# Patient Record
Sex: Male | Born: 1940 | Race: White | Hispanic: No | Marital: Married | Smoking: Former smoker
Health system: Southern US, Community
[De-identification: ages and names within clinical notes are randomized; demographics above are authoritative.]

## PROBLEM LIST (undated history)

## (undated) DIAGNOSIS — E785 Hyperlipidemia, unspecified: Principal | ICD-10-CM

## (undated) DIAGNOSIS — F0390 Unspecified dementia without behavioral disturbance: Secondary | ICD-10-CM

## (undated) DIAGNOSIS — Z0000 Encounter for general adult medical examination without abnormal findings: Principal | ICD-10-CM

## (undated) DIAGNOSIS — L98499 Non-pressure chronic ulcer of skin of other sites with unspecified severity: Secondary | ICD-10-CM

## (undated) DIAGNOSIS — Z9289 Personal history of other medical treatment: Secondary | ICD-10-CM

## (undated) DIAGNOSIS — J449 Chronic obstructive pulmonary disease, unspecified: Secondary | ICD-10-CM

## (undated) DIAGNOSIS — R972 Elevated prostate specific antigen [PSA]: Secondary | ICD-10-CM

## (undated) DIAGNOSIS — J300 Vasomotor rhinitis: Secondary | ICD-10-CM

## (undated) DIAGNOSIS — Z8619 Personal history of other infectious and parasitic diseases: Secondary | ICD-10-CM

## (undated) DIAGNOSIS — J209 Acute bronchitis, unspecified: Principal | ICD-10-CM

## (undated) HISTORY — DX: Non-pressure chronic ulcer of skin of other sites with unspecified severity: L98.499

## (undated) HISTORY — DX: Acute bronchitis, unspecified: J20.9

## (undated) HISTORY — DX: Personal history of other infectious and parasitic diseases: Z86.19

## (undated) HISTORY — DX: Encounter for general adult medical examination without abnormal findings: Z00.00

## (undated) HISTORY — DX: Personal history of other medical treatment: Z92.89

## (undated) HISTORY — PX: TONSILLECTOMY: SHX5217

## (undated) HISTORY — DX: Chronic obstructive pulmonary disease, unspecified: J44.9

## (undated) HISTORY — DX: Vasomotor rhinitis: J30.0

## (undated) HISTORY — DX: Hyperlipidemia, unspecified: E78.5

## (undated) HISTORY — DX: Elevated prostate specific antigen (PSA): R97.20

## (undated) HISTORY — DX: Unspecified dementia, unspecified severity, without behavioral disturbance, psychotic disturbance, mood disturbance, and anxiety: F03.90

---

## 1986-12-23 DIAGNOSIS — L98499 Non-pressure chronic ulcer of skin of other sites with unspecified severity: Secondary | ICD-10-CM

## 1986-12-23 DIAGNOSIS — Z9289 Personal history of other medical treatment: Secondary | ICD-10-CM

## 1986-12-23 HISTORY — DX: Non-pressure chronic ulcer of skin of other sites with unspecified severity: L98.499

## 1986-12-23 HISTORY — DX: Personal history of other medical treatment: Z92.89

## 1986-12-23 HISTORY — PX: ABDOMINAL SURGERY: SHX537

## 2011-12-24 HISTORY — PX: HERNIA REPAIR: SHX51

## 2012-03-31 ENCOUNTER — Ambulatory Visit (HOSPITAL_BASED_OUTPATIENT_CLINIC_OR_DEPARTMENT_OTHER)
Admission: RE | Admit: 2012-03-31 | Discharge: 2012-03-31 | Disposition: A | Discharge status: Home / Self Care | Payer: Medicare Other | Source: Ambulatory Visit | Attending: Internal Medicine | Admitting: Internal Medicine

## 2012-03-31 ENCOUNTER — Ambulatory Visit (INDEPENDENT_AMBULATORY_CARE_PROVIDER_SITE_OTHER): Payer: Medicare Other | Admitting: Internal Medicine

## 2012-03-31 ENCOUNTER — Encounter: Payer: Self-pay | Admitting: Internal Medicine

## 2012-03-31 VITALS — BP 130/80 | HR 54 | Temp 97.7°F | Resp 16 | Ht 69.25 in | Wt 148.0 lb

## 2012-03-31 DIAGNOSIS — R259 Unspecified abnormal involuntary movements: Secondary | ICD-10-CM

## 2012-03-31 DIAGNOSIS — K279 Peptic ulcer, site unspecified, unspecified as acute or chronic, without hemorrhage or perforation: Secondary | ICD-10-CM

## 2012-03-31 DIAGNOSIS — R634 Abnormal weight loss: Secondary | ICD-10-CM

## 2012-03-31 DIAGNOSIS — N529 Male erectile dysfunction, unspecified: Secondary | ICD-10-CM | POA: Insufficient documentation

## 2012-03-31 DIAGNOSIS — R413 Other amnesia: Secondary | ICD-10-CM

## 2012-03-31 DIAGNOSIS — R251 Tremor, unspecified: Secondary | ICD-10-CM | POA: Insufficient documentation

## 2012-03-31 DIAGNOSIS — J4489 Other specified chronic obstructive pulmonary disease: Secondary | ICD-10-CM | POA: Insufficient documentation

## 2012-03-31 DIAGNOSIS — J449 Chronic obstructive pulmonary disease, unspecified: Secondary | ICD-10-CM

## 2012-03-31 DIAGNOSIS — K4090 Unilateral inguinal hernia, without obstruction or gangrene, not specified as recurrent: Secondary | ICD-10-CM | POA: Insufficient documentation

## 2012-03-31 LAB — CBC WITH DIFFERENTIAL/PLATELET
Eosinophils Relative: 10 % — ABNORMAL HIGH (ref 0–5)
HCT: 49 % (ref 39.0–52.0)
Hemoglobin: 16.1 g/dL (ref 13.0–17.0)
Lymphocytes Relative: 20 % (ref 12–46)
Lymphs Abs: 1.4 10*3/uL (ref 0.7–4.0)
MCV: 94 fL (ref 78.0–100.0)
Monocytes Absolute: 0.6 10*3/uL (ref 0.1–1.0)
Monocytes Relative: 9 % (ref 3–12)
Neutro Abs: 4.5 10*3/uL (ref 1.7–7.7)
WBC: 7.3 10*3/uL (ref 4.0–10.5)

## 2012-03-31 LAB — BASIC METABOLIC PANEL
BUN: 17 mg/dL (ref 6–23)
CO2: 25 mEq/L (ref 19–32)
Chloride: 104 mEq/L (ref 96–112)
Creat: 0.8 mg/dL (ref 0.50–1.35)
Glucose, Bld: 115 mg/dL — ABNORMAL HIGH (ref 70–99)
Potassium: 4.8 mEq/L (ref 3.5–5.3)

## 2012-03-31 LAB — HEPATIC FUNCTION PANEL
ALT: 16 U/L (ref 0–53)
AST: 24 U/L (ref 0–37)
Albumin: 4.2 g/dL (ref 3.5–5.2)
Bilirubin, Direct: 0.2 mg/dL (ref 0.0–0.3)
Total Protein: 6.7 g/dL (ref 6.0–8.3)

## 2012-03-31 LAB — TSH: TSH: 3.069 u[IU]/mL (ref 0.350–4.500)

## 2012-03-31 NOTE — Assessment & Plan Note (Signed)
Obtain b12, tsh. Consider neurology evaluation pending lab results

## 2012-03-31 NOTE — Progress Notes (Signed)
  Subjective:    Patient ID: Brandon Quail., male    DOB: 03-29-1941, 71 y.o.   MRN: [id number]  HPI Pt presents to clinic for follow up of multiple medical problems. Accompanied by his wife who assists with hx with his permission. Has had difficulty with memory loss especially short term memory. Previous pmd recommended aricept but was not begun due to concern over possible gi side effects. No recent lab evaluation or cranial imaging. No safety concerns at present. 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 Previously failed primadone. Current taking propanolol without side effect. Recently while doing situps/crunches noted possible left inguinal hernia. No pain, swelling or tenderness. Has had ~15lb unintended wt loss over last 2 years. Has good appetite and no gi sx's. Does have remote h/o PUD ~1988.  Past Medical History  Diagnosis Date  . Asthma     childhood- age 89  . History of chicken pox     childhood  . Ulcer of abdomen wall 1988  . History of blood transfusion 1988   Past Surgical History  Procedure Date  . Abdominal surgery 1988    bleed ulcer  . Tonsillectomy     reports that he has quit smoking. He has never used smokeless tobacco. He reports that he drinks alcohol. He reports that he does not use illicit drugs. family history includes Hypertension in his mother.  There is no history of Colon cancer, and Heart disease, and Prostate cancer, and Breast cancer, and Diabetes, . No Known Allergies   Review of Systems  Constitutional: Positive for unexpected weight change. Negative for fever, chills and diaphoresis.  Respiratory: Negative for cough and wheezing.   Cardiovascular: Negative for chest pain.  Gastrointestinal: Negative for abdominal pain and blood in stool.  Neurological: Positive for tremors.  Hematological: Negative for adenopathy.  All other systems reviewed and are negative.       Objective:   Physical Exam  Physical  Exam  Nursing note and vitals reviewed. Constitutional: Appears well-developed and well-nourished. No distress.  HENT:  Head: Normocephalic and atraumatic.  Right Ear: External ear normal.  Left Ear: External ear normal.  Eyes: Conjunctivae are normal. No scleral icterus.  Neck: Neck supple. Carotid bruit is not present.  Cardiovascular: Normal rate, regular rhythm and normal heart sounds.  Exam reveals no gallop and no friction rub.   No murmur heard. Pulmonary/Chest: Effort normal and breath sounds normal. No respiratory distress. He has no wheezes. no rales.  Lymphadenopathy:    He has no cervical adenopathy. no axillary adenopathy. Neurological:Alert.  Skin: Skin is warm and dry. Not diaphoretic.  Psychiatric: Has a normal mood and affect.  GU: slight impulse with valsalva left inguinal area. NT      Assessment & Plan:

## 2012-03-31 NOTE — Assessment & Plan Note (Signed)
Obtain cbc, chem7, tsh, lft and cxr.

## 2012-03-31 NOTE — Assessment & Plan Note (Signed)
Currently asx. Recommend observation. Discussed potential warning signs and indications to seek immediate medical care.

## 2012-04-05 ENCOUNTER — Encounter: Payer: Self-pay | Admitting: Internal Medicine

## 2012-04-06 ENCOUNTER — Telehealth: Payer: Self-pay | Admitting: *Deleted

## 2012-04-06 DIAGNOSIS — R7309 Other abnormal glucose: Secondary | ICD-10-CM

## 2012-04-06 NOTE — Telephone Encounter (Signed)
Labs ok except sl elevated glucose. Add a1c if can if not a1c next visit. Needs to proceed with neurology

## 2012-04-06 NOTE — Telephone Encounter (Signed)
Patient submitted my chart message: I had an appointment with Dr. Rodena Medin on April, 9 and also had blood work done. I have not heard about the results of the tests. I want to know if anything was found that could be contributing to my memory loss. Or do I need to make an appointment with Dr. Vonita Moss, the neurologist who has been seeing me for tremors to also see if he has any ideas to help me with the memory issues.

## 2012-04-06 NOTE — Telephone Encounter (Signed)
Patient informed via my chart.

## 2012-04-21 ENCOUNTER — Telehealth: Payer: Self-pay | Admitting: Internal Medicine

## 2012-04-21 NOTE — Telephone Encounter (Signed)
Received medical records from [facility]. Brandon Young  P: [phone] F: [phone]

## 2012-06-02 ENCOUNTER — Encounter: Payer: Self-pay | Admitting: Internal Medicine

## 2012-06-02 ENCOUNTER — Ambulatory Visit (INDEPENDENT_AMBULATORY_CARE_PROVIDER_SITE_OTHER): Payer: Medicare Other | Admitting: Internal Medicine

## 2012-06-02 VITALS — BP 100/60 | HR 68 | Temp 97.6°F | Resp 18 | Ht 69.25 in | Wt 144.0 lb

## 2012-06-02 DIAGNOSIS — N529 Male erectile dysfunction, unspecified: Secondary | ICD-10-CM

## 2012-06-02 DIAGNOSIS — R7309 Other abnormal glucose: Secondary | ICD-10-CM

## 2012-06-02 DIAGNOSIS — R413 Other amnesia: Secondary | ICD-10-CM

## 2012-06-02 DIAGNOSIS — R634 Abnormal weight loss: Secondary | ICD-10-CM

## 2012-06-02 DIAGNOSIS — R739 Hyperglycemia, unspecified: Secondary | ICD-10-CM | POA: Insufficient documentation

## 2012-06-02 MED ORDER — TADALAFIL 20 MG PO TABS: ORAL_TABLET | ORAL | Status: DC

## 2012-06-02 NOTE — Progress Notes (Signed)
  Subjective:    Patient ID: Brandon Quail., male    DOB: 06/03/1941, 71 y.o.   MRN: [id number]  HPI Pt presents to clinic for followup of multiple medical problems. Since last visit hernia became symptomatic and is now s/p hernia repair without complication. Wt down 4lbs since last visit but complicated by recent surgery and disruption of normal eating pattern. Previously noted 15lb wt loss over 2 year period. Lab evaluation and cxr unremarkable with exception of mildly elevation glucose. Was unable to see neurology for memory loss due to recent surgery but plans to call for another appt soon. Requests refill of cialis and tolerates without side effect.   Past Medical History  Diagnosis Date  . Asthma     childhood- age 64  . History of chicken pox     childhood  . Ulcer of abdomen wall 1988  . History of blood transfusion 1988   Past Surgical History  Procedure Date  . Abdominal surgery 1988    bleed ulcer  . Tonsillectomy     reports that he has quit smoking. He has never used smokeless tobacco. He reports that he drinks alcohol. He reports that he does not use illicit drugs. family history includes Hypertension in his mother.  There is no history of Colon cancer, and Heart disease, and Prostate cancer, and Breast cancer, and Diabetes, . No Known Allergies    Review of Systems see hpi     Objective:   Physical Exam  Nursing note and vitals reviewed. Constitutional: He appears well-developed and well-nourished.  Neurological: He is alert.  Psychiatric: He has a normal mood and affect.          Assessment & Plan:

## 2012-06-02 NOTE — Assessment & Plan Note (Signed)
Obtain a1c.  

## 2012-06-02 NOTE — Assessment & Plan Note (Signed)
Reschedule neurology appt

## 2012-06-02 NOTE — Assessment & Plan Note (Signed)
Weight check one month. Evaluation neg to date.

## 2012-06-02 NOTE — Assessment & Plan Note (Signed)
Stable. rf cialis for prn use

## 2012-06-17 DIAGNOSIS — Z8719 Personal history of other diseases of the digestive system: Secondary | ICD-10-CM | POA: Insufficient documentation

## 2012-06-29 ENCOUNTER — Ambulatory Visit: Payer: Medicare Other | Admitting: Internal Medicine

## 2012-07-28 ENCOUNTER — Other Ambulatory Visit (HOSPITAL_BASED_OUTPATIENT_CLINIC_OR_DEPARTMENT_OTHER): Payer: Self-pay | Admitting: Unknown Physician Specialty

## 2012-07-28 ENCOUNTER — Ambulatory Visit (HOSPITAL_BASED_OUTPATIENT_CLINIC_OR_DEPARTMENT_OTHER)
Admission: RE | Admit: 2012-07-28 | Discharge: 2012-07-28 | Disposition: A | Discharge status: Home / Self Care | Payer: Medicare Other | Source: Ambulatory Visit | Attending: Unknown Physician Specialty | Admitting: Unknown Physician Specialty

## 2012-07-28 DIAGNOSIS — R259 Unspecified abnormal involuntary movements: Secondary | ICD-10-CM | POA: Insufficient documentation

## 2012-07-28 DIAGNOSIS — R413 Other amnesia: Secondary | ICD-10-CM

## 2012-12-31 ENCOUNTER — Other Ambulatory Visit: Payer: Self-pay | Admitting: Internal Medicine

## 2013-01-01 NOTE — Telephone Encounter (Signed)
Cialis request; Last Rx & OV 06.11.2013/SLS Please advise.

## 2013-02-06 ENCOUNTER — Other Ambulatory Visit: Payer: Self-pay

## 2013-04-12 ENCOUNTER — Encounter: Payer: Self-pay | Admitting: Family Medicine

## 2013-04-12 ENCOUNTER — Ambulatory Visit (INDEPENDENT_AMBULATORY_CARE_PROVIDER_SITE_OTHER): Payer: Medicare PPO | Admitting: Family Medicine

## 2013-04-12 ENCOUNTER — Ambulatory Visit (HOSPITAL_BASED_OUTPATIENT_CLINIC_OR_DEPARTMENT_OTHER)
Admission: RE | Admit: 2013-04-12 | Discharge: 2013-04-12 | Disposition: A | Discharge status: Home / Self Care | Payer: Medicare PPO | Source: Ambulatory Visit | Attending: Family Medicine | Admitting: Family Medicine

## 2013-04-12 VITALS — BP 144/90 | HR 52 | Temp 97.4°F | Ht 69.25 in | Wt 155.0 lb

## 2013-04-12 DIAGNOSIS — J209 Acute bronchitis, unspecified: Secondary | ICD-10-CM

## 2013-04-12 DIAGNOSIS — R05 Cough: Secondary | ICD-10-CM | POA: Insufficient documentation

## 2013-04-12 DIAGNOSIS — J3489 Other specified disorders of nose and nasal sinuses: Secondary | ICD-10-CM | POA: Insufficient documentation

## 2013-04-12 DIAGNOSIS — R059 Cough, unspecified: Secondary | ICD-10-CM | POA: Insufficient documentation

## 2013-04-12 MED ORDER — AMOXICILLIN-POT CLAVULANATE 875-125 MG PO TABS: 1.0000 | ORAL_TABLET | Freq: Two times a day (BID) | ORAL | Status: DC

## 2013-04-12 MED ORDER — GUAIFENESIN ER 600 MG PO TB12: 600.0000 mg | ORAL_TABLET | Freq: Two times a day (BID) | ORAL | Status: DC

## 2013-04-12 NOTE — Patient Instructions (Addendum)
  Increase clear fluids Start a probiotic such as Digestive Advantage or Align Try a tongue blade for the the tongue  Bronchitis Bronchitis is the body's way of reacting to injury and/or infection (inflammation) of the bronchi. Bronchi are the air tubes that extend from the windpipe into the lungs. If the inflammation becomes severe, it may cause shortness of breath. CAUSES  Inflammation may be caused by:  A virus.  Germs (bacteria).  Dust.  Allergens.  Pollutants and many other irritants. The cells lining the bronchial tree are covered with tiny hairs (cilia). These constantly beat upward, away from the lungs, toward the mouth. This keeps the lungs free of pollutants. When these cells become too irritated and are unable to do their job, mucus begins to develop. This causes the characteristic cough of bronchitis. The cough clears the lungs when the cilia are unable to do their job. Without either of these protective mechanisms, the mucus would settle in the lungs. Then you would develop pneumonia. Smoking is a common cause of bronchitis and can contribute to pneumonia. Stopping this habit is the single most important thing you can do to help yourself. TREATMENT   Your caregiver may prescribe an antibiotic if the cough is caused by bacteria. Also, medicines that open up your airways make it easier to breathe. Your caregiver may also recommend or prescribe an expectorant. It will loosen the mucus to be coughed up. Only take over-the-counter or prescription medicines for pain, discomfort, or fever as directed by your caregiver.  Removing whatever causes the problem (smoking, for example) is critical to preventing the problem from getting worse.  Cough suppressants may be prescribed for relief of cough symptoms.  Inhaled medicines may be prescribed to help with symptoms now and to help prevent problems from returning.  For those with recurrent (chronic) bronchitis, there may be a need for  steroid medicines. SEEK IMMEDIATE MEDICAL CARE IF:   During treatment, you develop more pus-like mucus (purulent sputum).  You have a fever.  Your baby is older than 3 months with a rectal temperature of 102 F (38.9 C) or higher.  Your baby is 57 months old or younger with a rectal temperature of 100.4 F (38 C) or higher.  You become progressively more ill.  You have increased difficulty breathing, wheezing, or shortness of breath. It is necessary to seek immediate medical care if you are elderly or sick from any other disease. MAKE SURE YOU:   Understand these instructions.  Will watch your condition.  Will get help right away if you are not doing well or get worse. Document Released: 12/09/2005 Document Revised: 03/02/2012 Document Reviewed: 10/18/2008 [facility] Patient Information 2013 [place], [place].

## 2013-04-14 ENCOUNTER — Encounter: Payer: Self-pay | Admitting: Family Medicine

## 2013-04-14 DIAGNOSIS — J209 Acute bronchitis, unspecified: Secondary | ICD-10-CM | POA: Insufficient documentation

## 2013-04-14 HISTORY — DX: Acute bronchitis, unspecified: J20.9

## 2013-04-14 NOTE — Progress Notes (Signed)
Patient ID: Brandon Habib., male   DOB: October 01, 1941, 72 y.o.   MRN: [id number] Brandon Young [id number] 1941-08-08 04/14/2013      Progress Note-Follow Up  Subjective  Chief Complaint  Chief Complaint  Patient presents with  . Follow-up    Bronchitis    HPI  Patient is a 72 year old male who is in today accompanied by his wife. He has a history of tremor and dementia history sling returned from a cruise and is struggling with a cough. On a cruise he was treated with frequent breathing treatments and antibiotics for bronchitis. He improved some but is worsened again. He said some chills and malaise. He has cough and congestion. No chest pain or palpitations. No shortness of breath GI or GU complaints  Past Medical History  Diagnosis Date  . Asthma     childhood- age 4  . History of chicken pox     childhood  . Ulcer of abdomen wall 1988  . History of blood transfusion 1988  . Acute bronchitis 04/14/2013    Past Surgical History  Procedure Laterality Date  . Abdominal surgery  1988    bleed ulcer  . Tonsillectomy      Family History  Problem Relation Age of Onset  . Colon cancer Neg Hx   . Heart disease Neg Hx   . Prostate cancer Neg Hx   . Breast cancer Neg Hx   . Diabetes Neg Hx   . Hypertension Mother     History   Social History  . Marital Status: Married    Spouse Name: N/A    Number of Children: N/A  . Years of Education: N/A   Occupational History  . Not on file.   Social History Main Topics  . Smoking status: Former Games developer  . Smokeless tobacco: Never Used     Comment: 1 ppd for 30 years  . Alcohol Use: Yes     Comment: wine  . Drug Use: No  . Sexually Active: Not on file   Other Topics Concern  . Not on file   Social History Narrative  . No narrative on file    Current Outpatient Prescriptions on File Prior to Visit  Medication Sig Dispense Refill  . aspirin 81 MG tablet Take 81 mg by mouth daily.      Marland Kitchen CIALIS 20 MG tablet  TAKE 1 TABLET BY MOUTH EVERY 36 HOURS AS NEEDED  5 tablet  3  . Multiple Vitamins-Minerals (CENTRUM SILVER PO) Take by mouth daily.      . propranolol (INDERAL) 40 MG tablet Take 40 mg by mouth. Take 2 tablets by mouth twice a day       No current facility-administered medications on file prior to visit.    No Known Allergies  Review of Systems  Review of Systems  Constitutional: Positive for chills and malaise/fatigue. Negative for fever.  HENT: Positive for congestion.   Eyes: Negative for discharge.  Respiratory: Positive for cough. Negative for shortness of breath.   Cardiovascular: Negative for chest pain, palpitations and leg swelling.  Gastrointestinal: Negative for nausea, abdominal pain and diarrhea.  Genitourinary: Negative for dysuria.  Musculoskeletal: Negative for falls.  Skin: Negative for rash.  Neurological: Positive for tremors. Negative for loss of consciousness and headaches.  Endo/Heme/Allergies: Negative for polydipsia.  Psychiatric/Behavioral: Negative for depression and suicidal ideas. The patient is not nervous/anxious and does not have insomnia.     Objective  BP 144/90  Pulse 52  Temp(Src) 97.4 F (36.3 C) (Oral)  Ht 5' 9.25" (1.759 m)  Wt 155 lb (70.308 kg)  BMI 22.72 kg/m2  SpO2 97%  Physical Exam  Physical Exam  Constitutional: He is oriented to person, place, and time and well-developed, well-nourished, and in no distress. No distress.  HENT:  Head: Normocephalic and atraumatic.  Eyes: Conjunctivae are normal.  Neck: Neck supple. No thyromegaly present.  Cardiovascular: Normal rate, regular rhythm and normal heart sounds.   No murmur heard. Pulmonary/Chest: Effort normal and breath sounds normal. No respiratory distress.  Abdominal: He exhibits no distension and no mass. There is no tenderness.  Musculoskeletal: He exhibits no edema.  Neurological: He is alert and oriented to person, place, and time.  Skin: Skin is warm.  Psychiatric:  Memory, affect and judgment normal.    Lab Results  Component Value Date   TSH 3.069 03/31/2012   Lab Results  Component Value Date   WBC 7.3 03/31/2012   HGB 16.1 03/31/2012   HCT 49.0 03/31/2012   MCV 94.0 03/31/2012   PLT 212 03/31/2012   Lab Results  Component Value Date   CREATININE 0.80 03/31/2012   BUN 17 03/31/2012   NA 140 03/31/2012   K 4.8 03/31/2012   CL 104 03/31/2012   CO2 25 03/31/2012   Lab Results  Component Value Date   ALT 16 03/31/2012   AST 24 03/31/2012   ALKPHOS 87 03/31/2012   BILITOT 0.8 03/31/2012    Assessment & Plan  Acute bronchitis cxr unconcerning but has symptoms for weeks unresolving, started on Augmentin, Mucinex and probiotics.

## 2013-04-14 NOTE — Assessment & Plan Note (Signed)
cxr unconcerning but has symptoms for weeks unresolving, started on Augmentin, Mucinex and probiotics.

## 2013-04-30 ENCOUNTER — Ambulatory Visit (INDEPENDENT_AMBULATORY_CARE_PROVIDER_SITE_OTHER): Payer: Medicare PPO | Admitting: Family Medicine

## 2013-04-30 ENCOUNTER — Encounter: Payer: Self-pay | Admitting: Family Medicine

## 2013-04-30 VITALS — BP 108/68 | HR 68 | Temp 97.5°F | Ht 69.25 in | Wt 149.1 lb

## 2013-04-30 DIAGNOSIS — R251 Tremor, unspecified: Secondary | ICD-10-CM

## 2013-04-30 DIAGNOSIS — Z1211 Encounter for screening for malignant neoplasm of colon: Secondary | ICD-10-CM

## 2013-04-30 DIAGNOSIS — R259 Unspecified abnormal involuntary movements: Secondary | ICD-10-CM

## 2013-04-30 DIAGNOSIS — F0390 Unspecified dementia without behavioral disturbance: Secondary | ICD-10-CM | POA: Insufficient documentation

## 2013-04-30 DIAGNOSIS — Z0000 Encounter for general adult medical examination without abnormal findings: Secondary | ICD-10-CM

## 2013-04-30 DIAGNOSIS — J209 Acute bronchitis, unspecified: Secondary | ICD-10-CM

## 2013-04-30 DIAGNOSIS — R7309 Other abnormal glucose: Secondary | ICD-10-CM

## 2013-04-30 DIAGNOSIS — J449 Chronic obstructive pulmonary disease, unspecified: Secondary | ICD-10-CM

## 2013-04-30 DIAGNOSIS — R739 Hyperglycemia, unspecified: Secondary | ICD-10-CM

## 2013-04-30 DIAGNOSIS — N529 Male erectile dysfunction, unspecified: Secondary | ICD-10-CM

## 2013-04-30 LAB — HEPATIC FUNCTION PANEL
ALT: 31 U/L (ref 0–53)
Alkaline Phosphatase: 96 U/L (ref 39–117)
Bilirubin, Direct: 0.1 mg/dL (ref 0.0–0.3)
Indirect Bilirubin: 0.7 mg/dL (ref 0.0–0.9)
Total Protein: 6.9 g/dL (ref 6.0–8.3)

## 2013-04-30 LAB — CBC
HCT: 48.1 % (ref 39.0–52.0)
MCV: 91.6 fL (ref 78.0–100.0)
RBC: 5.25 MIL/uL (ref 4.22–5.81)
WBC: 7.9 10*3/uL (ref 4.0–10.5)

## 2013-04-30 LAB — RENAL FUNCTION PANEL
BUN: 17 mg/dL (ref 6–23)
Chloride: 101 mEq/L (ref 96–112)
Glucose, Bld: 112 mg/dL — ABNORMAL HIGH (ref 70–99)
Phosphorus: 3 mg/dL (ref 2.3–4.6)
Potassium: 5.1 mEq/L (ref 3.5–5.3)
Sodium: 138 mEq/L (ref 135–145)

## 2013-04-30 LAB — PSA: PSA: 4.14 ng/mL — ABNORMAL HIGH (ref <=4.00)

## 2013-04-30 LAB — LIPID PANEL
HDL: 61 mg/dL (ref >39)
LDL Cholesterol: 146 mg/dL — ABNORMAL HIGH (ref 0–99)
VLDL: 20 mg/dL (ref 0–40)

## 2013-04-30 MED ORDER — ALBUTEROL SULFATE HFA 108 (90 BASE) MCG/ACT IN AERS: 2.0000 | INHALATION_SPRAY | Freq: Four times a day (QID) | RESPIRATORY_TRACT | Status: AC | PRN

## 2013-04-30 NOTE — Patient Instructions (Addendum)

## 2013-05-02 ENCOUNTER — Encounter: Payer: Self-pay | Admitting: Family Medicine

## 2013-05-02 DIAGNOSIS — Z0000 Encounter for general adult medical examination without abnormal findings: Secondary | ICD-10-CM | POA: Insufficient documentation

## 2013-05-02 DIAGNOSIS — J449 Chronic obstructive pulmonary disease, unspecified: Secondary | ICD-10-CM

## 2013-05-02 HISTORY — DX: Chronic obstructive pulmonary disease, unspecified: J44.9

## 2013-05-02 MED ORDER — TIOTROPIUM BROMIDE MONOHYDRATE 18 MCG IN CAPS: 18.0000 ug | ORAL_CAPSULE | Freq: Every day | RESPIRATORY_TRACT | Status: DC

## 2013-05-02 NOTE — Assessment & Plan Note (Signed)
They are interested in moving their neurology care to [facility], referred to [facility] Neurology. Maintain Donepezil and encouraged to start some krill oil caps

## 2013-05-02 NOTE — Assessment & Plan Note (Signed)
stable °

## 2013-05-02 NOTE — Progress Notes (Signed)
Patient ID: Brandon Young., male   DOB: Apr 17, 1941, 72 y.o.   MRN: [id number] Brandon Young [id number] 01-Dec-1941 05/02/2013      Progress Note-Follow Up  Subjective  Chief Complaint  Chief Complaint  Patient presents with  . medicare wellness    HPI  Patient is a 72 year old Caucasian male who is in today for wellness exam and followup of multiple medical problems. He is accompanied by his wife. They live in an assisted living facility and doing well. But to manage their daily activities and self-care. They have no concerns regarding depression. They're worried about his worsening dementia and are considering switching neurologists. He is tolerating the donepezil. He denies any recent illness or they do note is having worsening shortness of breath with exertion. No fevers or chills. No congestion or headache. No chest pain, palpitations, shortness of, GI or GU concerns. No recent changes in vision or hearing. Past Medical History  Diagnosis Date  . Asthma     childhood- age 8  . History of chicken pox     childhood  . Ulcer of abdomen wall 1988  . History of blood transfusion 1988  . Acute bronchitis 04/14/2013  . Dementia   . COPD (chronic obstructive pulmonary disease) 05/02/2013    Past Surgical History  Procedure Laterality Date  . Abdominal surgery  1988    bleed ulcer  . Tonsillectomy      Family History  Problem Relation Age of Onset  . Colon cancer Neg Hx   . Heart disease Neg Hx   . Prostate cancer Neg Hx   . Breast cancer Neg Hx   . Diabetes Neg Hx   . Hypertension Mother   . Dementia Mother   . Other Father     GI infection/disturbance  . Tremor Father   . COPD Sister     smoker  . Tremor Sister   . Tremor Brother   . Tremor Sister     History   Social History  . Marital Status: Married    Spouse Name: N/A    Number of Children: N/A  . Years of Education: N/A   Occupational History  . Not on file.   Social History Main Topics   . Smoking status: Former Games developer  . Smokeless tobacco: Never Used     Comment: 1 ppd for 30 years  . Alcohol Use: Yes     Comment: wine  . Drug Use: No  . Sexually Active: Yes   Other Topics Concern  . Not on file   Social History Narrative  . No narrative on file    Current Outpatient Prescriptions on File Prior to Visit  Medication Sig Dispense Refill  . aspirin 81 MG tablet Take 81 mg by mouth daily.      Marland Kitchen CIALIS 20 MG tablet TAKE 1 TABLET BY MOUTH EVERY 36 HOURS AS NEEDED  5 tablet  3  . guaiFENesin (MUCINEX) 600 MG 12 hr tablet Take 1 tablet (600 mg total) by mouth 2 (two) times daily.  20 tablet  0  . Multiple Vitamins-Minerals (CENTRUM SILVER PO) Take by mouth daily.      . propranolol (INDERAL) 40 MG tablet Take 40 mg by mouth. Take 2 tablets by mouth twice a day       No current facility-administered medications on file prior to visit.    No Known Allergies  Review of Systems  Review of Systems  Constitutional: Negative for fever and malaise/fatigue.  HENT: Negative for congestion.   Eyes: Negative for discharge.  Respiratory: Positive for shortness of breath. Negative for wheezing.   Cardiovascular: Negative for chest pain, palpitations and leg swelling.  Gastrointestinal: Negative for nausea, abdominal pain and diarrhea.  Genitourinary: Negative for dysuria.  Musculoskeletal: Negative for falls.  Skin: Negative for rash.  Neurological: Positive for tremors. Negative for loss of consciousness and headaches.  Endo/Heme/Allergies: Negative for polydipsia.  Psychiatric/Behavioral: Positive for memory loss. Negative for depression and suicidal ideas. The patient is not nervous/anxious and does not have insomnia.     Objective  BP 108/68  Pulse 68  Temp(Src) 97.5 F (36.4 C) (Oral)  Ht 5' 9.25" (1.759 m)  Wt 149 lb 1.3 oz (67.622 kg)  BMI 21.86 kg/m2  SpO2 90%  Physical Exam  Physical Exam  Constitutional: He is oriented to person, place, and time and  well-developed, well-nourished, and in no distress. No distress.  HENT:  Head: Normocephalic and atraumatic.  Eyes: Conjunctivae are normal.  Neck: Neck supple. No thyromegaly present.  Cardiovascular: Normal rate, regular rhythm and normal heart sounds.   No murmur heard. Pulmonary/Chest: Effort normal and breath sounds normal. No respiratory distress.  Abdominal: He exhibits no distension and no mass. There is no tenderness.  Musculoskeletal: He exhibits no edema.  Neurological: He is alert and oriented to person, place, and time.  Mild resting tremor  Skin: Skin is warm.  Psychiatric: Memory, affect and judgment normal.    Lab Results  Component Value Date   TSH 2.906 04/30/2013   Lab Results  Component Value Date   WBC 7.9 04/30/2013   HGB 16.6 04/30/2013   HCT 48.1 04/30/2013   MCV 91.6 04/30/2013   PLT 241 04/30/2013   Lab Results  Component Value Date   CREATININE 0.90 04/30/2013   BUN 17 04/30/2013   NA 138 04/30/2013   K 5.1 04/30/2013   CL 101 04/30/2013   CO2 28 04/30/2013   Lab Results  Component Value Date   ALT 31 04/30/2013   AST 43* 04/30/2013   ALKPHOS 96 04/30/2013   BILITOT 0.8 04/30/2013   Lab Results  Component Value Date   CHOL 227* 04/30/2013   Lab Results  Component Value Date   HDL 61 04/30/2013   Lab Results  Component Value Date   LDLCALC 146* 04/30/2013   Lab Results  Component Value Date   TRIG 101 04/30/2013   Lab Results  Component Value Date   CHOLHDL 3.7 04/30/2013     Assessment & Plan  Encounter for Medicare annual wellness exam Referred for screening colonoscopy. Doing well at his Assisted Living facility. Patient lives in an assisted living facility at times all of his ADLs well. They do not have any advanced directives and are not interested in a DO NOT RESUSCITATE at this time.  Dr. [name] of neurology at [facility] and no other M.D. did this time. Denies any depression.  COPD (chronic obstructive pulmonary disease) Spirometry done today,  given Albuterol to use prn and started on Spiriva and referred to Pulmonology  Dementia They are interested in moving their neurology care to [facility], referred to [facility] Neurology. Maintain Donepezil and encouraged to start some krill oil caps  Tremor stable  Hyperglycemia Mild, hgba1c is 5.3, minimize simple carbs

## 2013-05-02 NOTE — Assessment & Plan Note (Addendum)
Referred for screening colonoscopy. Doing well at his Assisted Living facility. Patient lives in an assisted living facility at times all of his ADLs well. They do not have any advanced directives and are not interested in a DO NOT RESUSCITATE at this time.  Dr. [name] of neurology at [facility] and no other M.D. did this time. Denies any depression.

## 2013-05-02 NOTE — Assessment & Plan Note (Signed)
Mild, hgba1c is 5.3, minimize simple carbs

## 2013-05-02 NOTE — Assessment & Plan Note (Signed)
Spirometry done today, given Albuterol to use prn and started on Spiriva and referred to Pulmonology

## 2013-05-05 ENCOUNTER — Other Ambulatory Visit: Payer: Self-pay | Admitting: Family Medicine

## 2013-05-05 DIAGNOSIS — R972 Elevated prostate specific antigen [PSA]: Secondary | ICD-10-CM

## 2013-05-05 MED ORDER — ATORVASTATIN CALCIUM 10 MG PO TABS: 10.0000 mg | ORAL_TABLET | Freq: Every day | ORAL | Status: DC

## 2013-05-05 NOTE — Addendum Note (Signed)
Addended by: Court Joy on: 05/05/2013 04:17 PM   Modules accepted: Orders

## 2013-05-18 ENCOUNTER — Encounter: Payer: Self-pay | Admitting: Diagnostic Neuroimaging

## 2013-05-18 ENCOUNTER — Ambulatory Visit (INDEPENDENT_AMBULATORY_CARE_PROVIDER_SITE_OTHER): Payer: Medicare PPO | Admitting: Diagnostic Neuroimaging

## 2013-05-18 VITALS — BP 128/80 | HR 53 | Ht 70.0 in | Wt 150.0 lb

## 2013-05-18 DIAGNOSIS — F0390 Unspecified dementia without behavioral disturbance: Secondary | ICD-10-CM

## 2013-05-18 NOTE — Patient Instructions (Addendum)
Our Designer, industrial/product, Lorenza Burton, will give you information about the [facility] [employer] clinical study.  Follow up appt. in 6 months.

## 2013-05-18 NOTE — Progress Notes (Signed)
[facility]  PATIENT: Brandon Young. DOB: 27-May-1941  REFERRING CLINICIAN: [name], MD HISTORY FROM: patient, wife, chart REASON FOR VISIT: consult for memory loss   HISTORICAL  CHIEF COMPLAINT:  Chief Complaint  Patient presents with  . Dementia    Np#7    HISTORY OF PRESENT ILLNESS:   Subjective:   72 y.o. right handed Caucasian male referred by Brandon Edge, MD for evaluation and treatment of cognitive problems. He is accompanied by spouse. He is currently a patient of Brandon Young, Neurology in [place].  Patient is able to do his own ADLs.  The family and the patient identify problems with changes in short term memory. Wife states she started noticing problems with short term memory 6 years ago. Family and patient report problems with disinterest in activities that he used to enjoy such as golf, which started about 3 years ago.  Family and patient are concerned about progression of the memory loss and want a 2nd opinion of treatment options.  Medication administration: wife monitors medication usage  Patient is driving with wife as passenger only.  Patient denies visual and sensory hallucinations.  Denies change in appetite.  Patient has been taking Donepezil for approximately a year and a half, now taking 10mg  twice daily with no side effects.  Functional Assessment:  Activities of Daily Living (ADLs):   He is independent in the following: ambulation, bathing and hygiene, feeding, continence, grooming, toileting and dressing Requires assistance with the following: medication administration.  Outside reports reviewed: historical medical records, imaging reports: normal for age MRI, lab reports and referral letter/letters.  The following portions of the patient's history were reviewed and updated as appropriate: allergies, current medications, past family history, past medical history, past social history, past surgical history and problem  list.  Review of Systems Constitutional: positive for feeling cold Hematologic/lymphatic: positive for easy bruising Neurological: positive for memory problems and confusion Behavioral/Psych: positive for loss of interest in favorite activities and decreased energy Endocrine: positive for temperature intolerance Allergic/Immunologic: allergies, runny nose   ALLERGIES: No Known Allergies  HOME MEDICATIONS: Outpatient Prescriptions Prior to Visit  Medication Sig Dispense Refill  . albuterol (PROVENTIL HFA;VENTOLIN HFA) 108 (90 BASE) MCG/ACT inhaler Inhale 2 puffs into the lungs every 6 (six) hours as needed for wheezing or shortness of breath.  1 Inhaler  5  . aspirin 81 MG tablet Take 81 mg by mouth daily.      Marland Kitchen atorvastatin (LIPITOR) 10 MG tablet Take 1 tablet (10 mg total) by mouth daily.  30 tablet  3  . CIALIS 20 MG tablet TAKE 1 TABLET BY MOUTH EVERY 36 HOURS AS NEEDED  5 tablet  3  . donepezil (ARICEPT) 10 MG tablet Take 2 tablets (20 mg total) by mouth at bedtime.      Marland Kitchen guaiFENesin (MUCINEX) 600 MG 12 hr tablet Take 1 tablet (600 mg total) by mouth 2 (two) times daily.  20 tablet  0  . Multiple Vitamins-Minerals (CENTRUM SILVER PO) Take by mouth daily.      . propranolol (INDERAL) 40 MG tablet Take 40 mg by mouth. Take 2 tablets by mouth twice a day      . tiotropium (SPIRIVA HANDIHALER) 18 MCG inhalation capsule Place 1 capsule (18 mcg total) into inhaler and inhale daily.  30 capsule  3   No facility-administered medications prior to visit.    PAST MEDICAL HISTORY: Past Medical History  Diagnosis Date  . Asthma  childhood- age 60  . History of chicken pox     childhood  . Ulcer of abdomen wall 1988  . History of blood transfusion 1988  . Acute bronchitis 04/14/2013  . Dementia   . COPD (chronic obstructive pulmonary disease) 05/02/2013    PAST SURGICAL HISTORY: Past Surgical History  Procedure Laterality Date  . Abdominal surgery  1988    bleed ulcer  .  Tonsillectomy    . Hernia repair  2013    FAMILY HISTORY: Family History  Problem Relation Age of Onset  . Colon cancer Neg Hx   . Heart disease Neg Hx   . Prostate cancer Neg Hx   . Breast cancer Neg Hx   . Diabetes Neg Hx   . Hypertension Mother   . Dementia Mother   . Other Father     GI infection/disturbance  . Tremor Father   . COPD Sister     smoker  . Tremor Sister   . Tremor Brother   . Tremor Sister     SOCIAL HISTORY:  History   Social History  . Marital Status: Married    Spouse Name: N/A    Number of Children: 2  . Years of Education: MED   Occupational History  . Not on file.   Social History Main Topics  . Smoking status: Former Games developer  . Smokeless tobacco: Never Used     Comment: 1 ppd for 30 years  . Alcohol Use: Yes     Comment: wine  . Drug Use: No  . Sexually Active: Yes   Other Topics Concern  . Not on file   Social History Narrative  . No narrative on file     PHYSICAL EXAM  Filed Vitals:   05/18/13 0859  BP: 128/80  Pulse: 53  Height: 5\' 10"  (1.778 m)  Weight: 150 lb (68.04 kg)   Body mass index is 21.52 kg/(m^2).  GENERAL EXAM: Patient is in no distress, well developed, well groomed  CARDIOVASCULAR: Regular rate and rhythm, no murmurs, no carotid bruits  NEUROLOGIC: MENTAL STATUS: awake, alert, language fluent, comprehension intact; MMSE 25/30. BORDERLINE MYERSON. NEG SNOUT. NEG PALMOMENTAL. CRANIAL NERVE: no papilledema on fundoscopic exam, pupils equal and reactive to light, visual fields full to confrontation, extraocular muscles intact, no nystagmus, facial sensation and strength symmetric, uvula midline, shoulder shrug symmetric, tongue midline. MOTOR: normal bulk and tone, full strength in the BUE, BLE,  fine finger movements normal. mixed tremor: resting tremor of head and left hand, intention tremor in right hand. SENSORY: normal and symmetric to light touch, pinprick, temperature, vibration COORDINATION:  finger-nose-finger  REFLEXES: deep tendon reflexes present and symmetric GAIT/STATION: narrow based gait; able to walk on toes, heels and tandem; romberg is negative. Decreased arm swing when walking. Borderline Myerson's sign.   DIAGNOSTIC DATA (LABS, IMAGING, TESTING) - I reviewed patient records, labs, notes, testing and imaging myself where available.  Lab Results  Component Value Date   WBC 7.9 04/30/2013   HGB 16.6 04/30/2013   HCT 48.1 04/30/2013   MCV 91.6 04/30/2013   PLT 241 04/30/2013      Component Value Date/Time   NA 138 04/30/2013 0918   K 5.1 04/30/2013 0918   CL 101 04/30/2013 0918   CO2 28 04/30/2013 0918   GLUCOSE 112* 04/30/2013 0918   BUN 17 04/30/2013 0918   CREATININE 0.90 04/30/2013 0918   CALCIUM 9.7 04/30/2013 0918   PROT 6.9 04/30/2013 0918   ALBUMIN 4.2 04/30/2013 1610  ALBUMIN 4.2 04/30/2013 0918   AST 43* 04/30/2013 0918   ALT 31 04/30/2013 0918   ALKPHOS 96 04/30/2013 0918   BILITOT 0.8 04/30/2013 0918   Lab Results  Component Value Date   CHOL 227* 04/30/2013   HDL 61 04/30/2013   LDLCALC 865* 04/30/2013   TRIG 101 04/30/2013   CHOLHDL 3.7 04/30/2013   Lab Results  Component Value Date   HGBA1C 5.3 04/30/2013   Lab Results  Component Value Date   VITAMINB12 738 03/31/2012   Lab Results  Component Value Date   TSH 2.906 04/30/2013    ASSESSMENT AND PLAN  72 y.o. year old male  has a past medical history of Asthma; History of chicken pox; Ulcer of abdomen wall (1988); History of blood transfusion (1988); Acute bronchitis (04/14/2013); Dementia; and COPD (chronic obstructive pulmonary disease) (05/02/2013). here for evaluation of memory loss.  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 Discussed progressive course of the disease and treatment options as well as research studies.  1. Discussed Expedition 3 clinical study, Lorenza Burton, Designer, industrial/product will speak to patient and wife and  provide information. 2. Continue current dose of Donepizil, 10mg  BID.  If further decline in 6 months and patient not enrolled in clinical study, will consider adding Namenda. 3. Follow up in 6 months with Heide Guile, NP.  Suanne Marker, MD (LYNN LAM NP-C 05/18/2013, 9:58 AM) Certified in Neurology, Neurophysiology and Neuroimaging  [facility] [address] [place], [place] [address] [phone]

## 2013-05-20 ENCOUNTER — Encounter (HOSPITAL_BASED_OUTPATIENT_CLINIC_OR_DEPARTMENT_OTHER): Payer: Self-pay | Admitting: *Deleted

## 2013-05-20 ENCOUNTER — Emergency Department (HOSPITAL_BASED_OUTPATIENT_CLINIC_OR_DEPARTMENT_OTHER)
Admission: EM | Admit: 2013-05-20 | Discharge: 2013-05-21 | Disposition: A | Discharge status: Home / Self Care | Payer: Medicare PPO | Attending: Emergency Medicine | Admitting: Emergency Medicine

## 2013-05-20 DIAGNOSIS — Z7982 Long term (current) use of aspirin: Secondary | ICD-10-CM | POA: Insufficient documentation

## 2013-05-20 DIAGNOSIS — Z8619 Personal history of other infectious and parasitic diseases: Secondary | ICD-10-CM | POA: Insufficient documentation

## 2013-05-20 DIAGNOSIS — F0390 Unspecified dementia without behavioral disturbance: Secondary | ICD-10-CM | POA: Insufficient documentation

## 2013-05-20 DIAGNOSIS — J449 Chronic obstructive pulmonary disease, unspecified: Secondary | ICD-10-CM | POA: Insufficient documentation

## 2013-05-20 DIAGNOSIS — Z87891 Personal history of nicotine dependence: Secondary | ICD-10-CM | POA: Insufficient documentation

## 2013-05-20 DIAGNOSIS — N390 Urinary tract infection, site not specified: Secondary | ICD-10-CM

## 2013-05-20 DIAGNOSIS — J4489 Other specified chronic obstructive pulmonary disease: Secondary | ICD-10-CM | POA: Insufficient documentation

## 2013-05-20 DIAGNOSIS — Z8719 Personal history of other diseases of the digestive system: Secondary | ICD-10-CM | POA: Insufficient documentation

## 2013-05-20 DIAGNOSIS — Z79899 Other long term (current) drug therapy: Secondary | ICD-10-CM | POA: Insufficient documentation

## 2013-05-20 LAB — URINALYSIS, ROUTINE W REFLEX MICROSCOPIC
Bilirubin Urine: NEGATIVE
Glucose, UA: NEGATIVE mg/dL
Ketones, ur: NEGATIVE mg/dL
Protein, ur: NEGATIVE mg/dL
pH: 6.5 (ref 5.0–8.0)

## 2013-05-20 LAB — URINE MICROSCOPIC-ADD ON

## 2013-05-20 MED ORDER — PHENAZOPYRIDINE HCL 200 MG PO TABS: 200.0000 mg | ORAL_TABLET | Freq: Three times a day (TID) | ORAL | Status: DC

## 2013-05-20 MED ORDER — SULFAMETHOXAZOLE-TRIMETHOPRIM 800-160 MG PO TABS: 1.0000 | ORAL_TABLET | Freq: Two times a day (BID) | ORAL | Status: DC

## 2013-05-20 MED ORDER — PHENAZOPYRIDINE HCL 100 MG PO TABS
200.0000 mg | ORAL_TABLET | Freq: Once | ORAL | Status: AC
  Administered 2013-05-21: 200 mg via ORAL
  Filled 2013-05-20: qty 2

## 2013-05-20 MED ORDER — SULFAMETHOXAZOLE-TMP DS 800-160 MG PO TABS
1.0000 | ORAL_TABLET | Freq: Once | ORAL | Status: AC
  Administered 2013-05-21: 1 via ORAL
  Filled 2013-05-20: qty 1

## 2013-05-20 NOTE — ED Provider Notes (Signed)
History     CSN: [id number]  Arrival date & time 05/20/13  2115   First MD Initiated Contact with Patient 05/20/13 2342      Chief Complaint  Patient presents with  . Dysuria    (Consider location/radiation/quality/duration/timing/severity/associated sxs/prior treatment) HPI This is a 72 year old male with a one-day history of urinary urgency and the sensation that he cannot empty his bladder fully. The urgency is moderate. The sensation of not voiding completely is mild. He denies fever, chills, nausea or vomiting. He denies malodorous urine. There no specific mitigating or exacerbating circumstances. He has a baseline tremor.  Past Medical History  Diagnosis Date  . Asthma     childhood- age 69  . History of chicken pox     childhood  . Ulcer of abdomen wall 1988  . History of blood transfusion 1988  . Acute bronchitis 04/14/2013  . Dementia   . COPD (chronic obstructive pulmonary disease) 05/02/2013    Past Surgical History  Procedure Laterality Date  . Abdominal surgery  1988    bleed ulcer  . Tonsillectomy    . Hernia repair  2013    Family History  Problem Relation Age of Onset  . Colon cancer Neg Hx   . Heart disease Neg Hx   . Prostate cancer Neg Hx   . Breast cancer Neg Hx   . Diabetes Neg Hx   . Hypertension Mother   . Dementia Mother   . Other Father     GI infection/disturbance  . Tremor Father   . COPD Sister     smoker  . Tremor Sister   . Tremor Brother   . Tremor Sister     History  Substance Use Topics  . Smoking status: Former Games developer  . Smokeless tobacco: Never Used     Comment: 1 ppd for 30 years  . Alcohol Use: Yes     Comment: wine      Review of Systems  All other systems reviewed and are negative.    Allergies  Review of patient's allergies indicates no known allergies.  Home Medications   Current Outpatient Rx  Name  Route  Sig  Dispense  Refill  . albuterol (PROVENTIL HFA;VENTOLIN HFA) 108 (90 BASE) MCG/ACT  inhaler   Inhalation   Inhale 2 puffs into the lungs every 6 (six) hours as needed for wheezing or shortness of breath.   1 Inhaler   5   . aspirin 81 MG tablet   Oral   Take 81 mg by mouth daily.         Marland Kitchen atorvastatin (LIPITOR) 10 MG tablet   Oral   Take 1 tablet (10 mg total) by mouth daily.   30 tablet   3   . CIALIS 20 MG tablet      TAKE 1 TABLET BY MOUTH EVERY 36 HOURS AS NEEDED   5 tablet   3   . donepezil (ARICEPT) 10 MG tablet   Oral   Take 2 tablets (20 mg total) by mouth at bedtime.         Marland Kitchen guaiFENesin (MUCINEX) 600 MG 12 hr tablet   Oral   Take 1 tablet (600 mg total) by mouth 2 (two) times daily.   20 tablet   0   . Multiple Vitamins-Minerals (CENTRUM SILVER PO)   Oral   Take by mouth daily.         . propranolol (INDERAL) 40 MG tablet   Oral  Take 40 mg by mouth. Take 2 tablets by mouth twice a day         . tiotropium (SPIRIVA HANDIHALER) 18 MCG inhalation capsule   Inhalation   Place 1 capsule (18 mcg total) into inhaler and inhale daily.   30 capsule   3     BP 136/80  Pulse 58  Temp(Src) 98.6 F (37 C) (Oral)  Resp 18  Ht 5\' 10"  (1.778 m)  Wt 150 lb (68.04 kg)  BMI 21.52 kg/m2  SpO2 95%  Physical Exam General: Well-developed, well-nourished male in no acute distress; appearance consistent with age of record HENT: normocephalic, atraumatic Eyes: pupils equal round and reactive to light; extraocular muscles intact Neck: supple Heart: regular rate and rhythm Lungs: clear to auscultation bilaterally Abdomen: soft; nondistended; nontender; no masses or hepatosplenomegaly; bowel sounds present GU: No CVA tenderness Extremities: No deformity; full range of motion; pulses normal Neurologic: Awake, alert and oriented; motor function intact in all extremities and symmetric; no facial droop; resting tremor Skin: Warm and dry Psychiatric: Normal mood and affect    ED Course  Procedures (including critical care  time)    MDM   Nursing notes and vitals signs, including pulse oximetry, reviewed.  Summary of this visit's results, reviewed by myself:  Labs:  Results for orders placed during the hospital encounter of 05/20/13 (from the past 24 hour(s))  URINALYSIS, ROUTINE W REFLEX MICROSCOPIC     Status: Abnormal   Collection Time    05/20/13  9:42 PM      Result Value Range   Color, Urine YELLOW  YELLOW   APPearance CLOUDY (*) CLEAR   Specific Gravity, Urine 1.005  1.005 - 1.030   pH 6.5  5.0 - 8.0   Glucose, UA NEGATIVE  NEGATIVE mg/dL   Hgb urine dipstick MODERATE (*) NEGATIVE   Bilirubin Urine NEGATIVE  NEGATIVE   Ketones, ur NEGATIVE  NEGATIVE mg/dL   Protein, ur NEGATIVE  NEGATIVE mg/dL   Urobilinogen, UA 0.2  0.0 - 1.0 mg/dL   Nitrite NEGATIVE  NEGATIVE   Leukocytes, UA LARGE (*) NEGATIVE  URINE MICROSCOPIC-ADD ON     Status: Abnormal   Collection Time    05/20/13  9:42 PM      Result Value Range   Squamous Epithelial / LPF RARE  RARE   WBC, UA TOO NUMEROUS TO COUNT  <3 WBC/hpf   RBC / HPF 7-10  <3 RBC/hpf   Bacteria, UA FEW (*) RARE            Hanley Seamen, MD 05/20/13 2356

## 2013-05-20 NOTE — ED Notes (Signed)
Pt c/o a feeling of not being able to completely empty his bladder today. Pt denies odor to urine.

## 2013-05-22 ENCOUNTER — Encounter (HOSPITAL_BASED_OUTPATIENT_CLINIC_OR_DEPARTMENT_OTHER): Payer: Self-pay | Admitting: *Deleted

## 2013-05-22 ENCOUNTER — Emergency Department (HOSPITAL_BASED_OUTPATIENT_CLINIC_OR_DEPARTMENT_OTHER)
Admission: EM | Admit: 2013-05-22 | Discharge: 2013-05-22 | Disposition: A | Discharge status: Home / Self Care | Payer: Medicare PPO | Attending: Emergency Medicine | Admitting: Emergency Medicine

## 2013-05-22 DIAGNOSIS — R339 Retention of urine, unspecified: Secondary | ICD-10-CM | POA: Insufficient documentation

## 2013-05-22 DIAGNOSIS — F0390 Unspecified dementia without behavioral disturbance: Secondary | ICD-10-CM | POA: Insufficient documentation

## 2013-05-22 DIAGNOSIS — Z87440 Personal history of urinary (tract) infections: Secondary | ICD-10-CM | POA: Insufficient documentation

## 2013-05-22 DIAGNOSIS — J4489 Other specified chronic obstructive pulmonary disease: Secondary | ICD-10-CM | POA: Insufficient documentation

## 2013-05-22 DIAGNOSIS — Z7982 Long term (current) use of aspirin: Secondary | ICD-10-CM | POA: Insufficient documentation

## 2013-05-22 DIAGNOSIS — Z872 Personal history of diseases of the skin and subcutaneous tissue: Secondary | ICD-10-CM | POA: Insufficient documentation

## 2013-05-22 DIAGNOSIS — Z87891 Personal history of nicotine dependence: Secondary | ICD-10-CM | POA: Insufficient documentation

## 2013-05-22 DIAGNOSIS — Z79899 Other long term (current) drug therapy: Secondary | ICD-10-CM | POA: Insufficient documentation

## 2013-05-22 DIAGNOSIS — Z8619 Personal history of other infectious and parasitic diseases: Secondary | ICD-10-CM | POA: Insufficient documentation

## 2013-05-22 DIAGNOSIS — J449 Chronic obstructive pulmonary disease, unspecified: Secondary | ICD-10-CM | POA: Insufficient documentation

## 2013-05-22 LAB — URINALYSIS, ROUTINE W REFLEX MICROSCOPIC
Bilirubin Urine: NEGATIVE
Glucose, UA: NEGATIVE mg/dL
Hgb urine dipstick: NEGATIVE
Ketones, ur: 15 mg/dL — AB
Nitrite: POSITIVE — AB
Specific Gravity, Urine: 1.011 (ref 1.005–1.030)
pH: 6 (ref 5.0–8.0)

## 2013-05-22 LAB — BASIC METABOLIC PANEL
BUN: 14 mg/dL (ref 6–23)
CO2: 26 mEq/L (ref 19–32)
Chloride: 100 mEq/L (ref 96–112)
Creatinine, Ser: 1 mg/dL (ref 0.50–1.35)
GFR calc Af Amer: 85 mL/min — ABNORMAL LOW (ref >90)
Potassium: 3.9 mEq/L (ref 3.5–5.1)

## 2013-05-22 LAB — URINE CULTURE: Colony Count: 90000

## 2013-05-22 NOTE — ED Notes (Addendum)
C/O burning when urinating and unable to void except small amounts. Patient states he was seen  here on Thursday and seen for uti, taking antibiotic. Followed up with urologost and placed on uribel tid and zofran 4mg  ODT prn

## 2013-05-22 NOTE — ED Notes (Signed)
Leg bag attached to foley catheter drain, care of bag discussed with patient's wife, she verbalized understanding

## 2013-05-22 NOTE — ED Provider Notes (Signed)
History     CSN: [id number]  Arrival date & time 05/22/13  4098   First MD Initiated Contact with Patient 05/22/13 [phone]    Level V caveat, dementia history is obtained from old records from patient and from patient's wife  Chief Complaint  Patient presents with  . Dysuria    (Consider location/radiation/quality/duration/timing/severity/associated sxs/prior treatment) HPI Complains of dysuria onset 2 days ago. Patient seen here 2 days ago determined to have urinary tract infection treated with Bactrim. Seen a urologist office yesterday started onuribel. Also taking Zofran for nausea as needed. Presently feels that he is unable to empty his bladder fully. No fever no vomiting no other complaint. Past Medical History  Diagnosis Date  . Asthma     childhood- age 54  . History of chicken pox     childhood  . Ulcer of abdomen wall 1988  . History of blood transfusion 1988  . Acute bronchitis 04/14/2013  . Dementia   . COPD (chronic obstructive pulmonary disease) 05/02/2013    Past Surgical History  Procedure Laterality Date  . Abdominal surgery  1988    bleed ulcer  . Tonsillectomy    . Hernia repair  2013    Family History  Problem Relation Age of Onset  . Colon cancer Neg Hx   . Heart disease Neg Hx   . Prostate cancer Neg Hx   . Breast cancer Neg Hx   . Diabetes Neg Hx   . Hypertension Mother   . Dementia Mother   . Other Father     GI infection/disturbance  . Tremor Father   . COPD Sister     smoker  . Tremor Sister   . Tremor Brother   . Tremor Sister     History  Substance Use Topics  . Smoking status: Former Games developer  . Smokeless tobacco: Never Used     Comment: 1 ppd for 30 years  . Alcohol Use: Yes     Comment: wine      Review of Systems  Unable to perform ROS: Dementia  Genitourinary: Positive for dysuria.    Allergies  Review of patient's allergies indicates no known allergies.  Home Medications   Current Outpatient Rx  Name  Route  Sig   Dispense  Refill  . Meth-Hyo-M Bl-Na Phos-Ph Sal (URIBEL PO)   Oral   Take by mouth 3 (three) times daily.         . ondansetron (ZOFRAN-ODT) 4 MG disintegrating tablet   Oral   Take 4 mg by mouth every 8 (eight) hours as needed for nausea.         Marland Kitchen albuterol (PROVENTIL HFA;VENTOLIN HFA) 108 (90 BASE) MCG/ACT inhaler   Inhalation   Inhale 2 puffs into the lungs every 6 (six) hours as needed for wheezing or shortness of breath.   1 Inhaler   5   . aspirin 81 MG tablet   Oral   Take 81 mg by mouth daily.         Marland Kitchen atorvastatin (LIPITOR) 10 MG tablet   Oral   Take 1 tablet (10 mg total) by mouth daily.   30 tablet   3   . CIALIS 20 MG tablet      TAKE 1 TABLET BY MOUTH EVERY 36 HOURS AS NEEDED   5 tablet   3   . donepezil (ARICEPT) 10 MG tablet   Oral   Take 2 tablets (20 mg total) by mouth at bedtime.         Marland Kitchen  guaiFENesin (MUCINEX) 600 MG 12 hr tablet   Oral   Take 1 tablet (600 mg total) by mouth 2 (two) times daily.   20 tablet   0   . Multiple Vitamins-Minerals (CENTRUM SILVER PO)   Oral   Take by mouth daily.         . phenazopyridine (PYRIDIUM) 200 MG tablet   Oral   Take 1 tablet (200 mg total) by mouth 3 (three) times daily.   6 tablet   0   . propranolol (INDERAL) 40 MG tablet   Oral   Take 40 mg by mouth. Take 2 tablets by mouth twice a day         . sulfamethoxazole-trimethoprim (SEPTRA DS) 800-160 MG per tablet   Oral   Take 1 tablet by mouth every 12 (twelve) hours.   14 tablet   0   . tiotropium (SPIRIVA HANDIHALER) 18 MCG inhalation capsule   Inhalation   Place 1 capsule (18 mcg total) into inhaler and inhale daily.   30 capsule   3     BP 103/59  Pulse 54  Temp(Src) 97.7 F (36.5 C) (Oral)  Resp 18  Ht 5\' 10"  (1.778 m)  Wt 150 lb (68.04 kg)  BMI 21.52 kg/m2  SpO2 97%  Physical Exam  Nursing note and vitals reviewed. Constitutional: He appears well-developed and well-nourished.  HENT:  Head: Normocephalic and  atraumatic.  Eyes: Conjunctivae are normal. Pupils are equal, round, and reactive to light.  Neck: Neck supple. No tracheal deviation present. No thyromegaly present.  Cardiovascular: Normal rate and regular rhythm.   No murmur heard. Pulmonary/Chest: Effort normal and breath sounds normal.  Abdominal: Soft. Bowel sounds are normal. He exhibits no distension. There is no tenderness.  Genitourinary: Penis normal.  Uncircumcised  Musculoskeletal: Normal range of motion. He exhibits no edema and no tenderness.  Neurological: He is alert. Coordination normal.  Skin: Skin is warm and dry. No rash noted.  Psychiatric: He has a normal mood and affect.    ED Course  Procedures (including critical care time)  Labs Reviewed - No data to display No results found.  Results for orders placed during the hospital encounter of 05/22/13  URINALYSIS, ROUTINE W REFLEX MICROSCOPIC      Result Value Range   Color, Urine GREEN (*) YELLOW   APPearance CLEAR  CLEAR   Specific Gravity, Urine 1.011  1.005 - 1.030   pH 6.0  5.0 - 8.0   Glucose, UA NEGATIVE  NEGATIVE mg/dL   Hgb urine dipstick NEGATIVE  NEGATIVE   Bilirubin Urine NEGATIVE  NEGATIVE   Ketones, ur 15 (*) NEGATIVE mg/dL   Protein, ur NEGATIVE  NEGATIVE mg/dL   Urobilinogen, UA 1.0  0.0 - 1.0 mg/dL   Nitrite POSITIVE (*) NEGATIVE   Leukocytes, UA SMALL (*) NEGATIVE  BASIC METABOLIC PANEL      Result Value Range   Sodium 137  135 - 145 mEq/L   Potassium 3.9  3.5 - 5.1 mEq/L   Chloride 100  96 - 112 mEq/L   CO2 26  19 - 32 mEq/L   Glucose, Bld 103 (*) 70 - 99 mg/dL   BUN 14  6 - 23 mg/dL   Creatinine, Ser 4.09  0.50 - 1.35 mg/dL   Calcium 9.1  8.4 - 81.1 mg/dL   GFR calc non Af Amer 73 (*) >90 mL/min   GFR calc Af Amer 85 (*) >90 mL/min  URINE MICROSCOPIC-ADD ON  Result Value Range   Squamous Epithelial / LPF FEW (*) RARE   WBC, UA 7-10  <3 WBC/hpf   RBC / HPF 0-2  <3 RBC/hpf   Bacteria, UA FEW (*) RARE   No results  found.  No diagnosis found.  Foley catheter inserted by nurse. Greater than 400 mL of urine in Foley bag Urine culture is pending from ED visit from 05/20/2013 MDM  Plan continue present medications. Foley catheter with leg bag to go home Diagnosis #1 urinary retention #2 urinary tract infection        Doug Sou, MD 05/22/13 1115

## 2013-05-23 ENCOUNTER — Telehealth (HOSPITAL_COMMUNITY): Payer: Self-pay | Admitting: Emergency Medicine

## 2013-05-23 NOTE — ED Notes (Signed)
Post ED Visit - Positive Culture Follow-up  Culture report reviewed by antimicrobial stewardship pharmacist: []  Wes Dulaney, Pharm.D., BCPS []  Celedonio Miyamoto, Pharm.D., BCPS []  Georgina Pillion, Pharm.D., BCPS []  [place], [place].D., BCPS, AAHIVP [x]  Estella Husk, Pharm.D., BCPS, AAHIVP  Positive urine culture Treated with Sulfa-Trimeth, organism sensitive to the same and no further patient follow-up is required at this time.  Kylie A Holland 05/23/2013, 5:08 PM

## 2013-05-24 LAB — URINE CULTURE
Colony Count: NO GROWTH
Culture: NO GROWTH

## 2013-05-26 ENCOUNTER — Ambulatory Visit: Payer: Self-pay

## 2013-05-27 ENCOUNTER — Ambulatory Visit: Payer: Self-pay

## 2013-07-28 ENCOUNTER — Other Ambulatory Visit: Payer: Self-pay

## 2013-08-11 ENCOUNTER — Ambulatory Visit: Payer: Medicare PPO | Admitting: Family Medicine

## 2013-08-17 ENCOUNTER — Telehealth: Payer: Self-pay | Admitting: Diagnostic Neuroimaging

## 2013-08-17 ENCOUNTER — Ambulatory Visit (INDEPENDENT_AMBULATORY_CARE_PROVIDER_SITE_OTHER): Payer: Medicare PPO | Admitting: Family Medicine

## 2013-08-17 ENCOUNTER — Encounter: Payer: Self-pay | Admitting: Family Medicine

## 2013-08-17 VITALS — BP 100/78 | HR 54 | Temp 97.6°F | Ht 70.0 in | Wt 145.1 lb

## 2013-08-17 DIAGNOSIS — R739 Hyperglycemia, unspecified: Secondary | ICD-10-CM

## 2013-08-17 DIAGNOSIS — R634 Abnormal weight loss: Secondary | ICD-10-CM

## 2013-08-17 DIAGNOSIS — J449 Chronic obstructive pulmonary disease, unspecified: Secondary | ICD-10-CM

## 2013-08-17 DIAGNOSIS — N390 Urinary tract infection, site not specified: Secondary | ICD-10-CM

## 2013-08-17 DIAGNOSIS — R7309 Other abnormal glucose: Secondary | ICD-10-CM

## 2013-08-17 DIAGNOSIS — R972 Elevated prostate specific antigen [PSA]: Secondary | ICD-10-CM

## 2013-08-17 DIAGNOSIS — E785 Hyperlipidemia, unspecified: Secondary | ICD-10-CM

## 2013-08-17 LAB — LIPID PANEL
Cholesterol: 199 mg/dL (ref 0–200)
Triglycerides: 84 mg/dL (ref <150)
VLDL: 17 mg/dL (ref 0–40)

## 2013-08-17 LAB — HEPATIC FUNCTION PANEL
ALT: 29 U/L (ref 0–53)
Bilirubin, Direct: 0.2 mg/dL (ref 0.0–0.3)
Total Protein: 6.6 g/dL (ref 6.0–8.3)

## 2013-08-17 LAB — RENAL FUNCTION PANEL
Albumin: 4.2 g/dL (ref 3.5–5.2)
Calcium: 9.4 mg/dL (ref 8.4–10.5)
Glucose, Bld: 109 mg/dL — ABNORMAL HIGH (ref 70–99)
Sodium: 138 mEq/L (ref 135–145)

## 2013-08-17 LAB — CBC
Hemoglobin: 16.3 g/dL (ref 13.0–17.0)
MCH: 31 pg (ref 26.0–34.0)
MCHC: 34.2 g/dL (ref 30.0–36.0)
RDW: 13.8 % (ref 11.5–15.5)

## 2013-08-17 MED ORDER — ATORVASTATIN CALCIUM 10 MG PO TABS: 10.0000 mg | ORAL_TABLET | Freq: Every day | ORAL | Status: DC

## 2013-08-17 NOTE — Patient Instructions (Addendum)
Start a probiotic such as Digestive Advantage or a generic daily  Urinary Tract Infection Urinary tract infections (UTIs) can develop anywhere along your urinary tract. Your urinary tract is your body's drainage system for removing wastes and extra water. Your urinary tract includes two kidneys, two ureters, a bladder, and a urethra. Your kidneys are a pair of bean-shaped organs. Each kidney is about the size of your fist. They are located below your ribs, one on each side of your spine. CAUSES Infections are caused by microbes, which are microscopic organisms, including fungi, viruses, and bacteria. These organisms are so small that they can only be seen through a microscope. Bacteria are the microbes that most commonly cause UTIs. SYMPTOMS  Symptoms of UTIs may vary by age and gender of the patient and by the location of the infection. Symptoms in young women typically include a frequent and intense urge to urinate and a painful, burning feeling in the bladder or urethra during urination. Older women and men are more likely to be tired, shaky, and weak and have muscle aches and abdominal pain. A fever may mean the infection is in your kidneys. Other symptoms of a kidney infection include pain in your back or sides below the ribs, nausea, and vomiting. DIAGNOSIS To diagnose a UTI, your caregiver will ask you about your symptoms. Your caregiver also will ask to provide a urine sample. The urine sample will be tested for bacteria and white blood cells. White blood cells are made by your body to help fight infection. TREATMENT  Typically, UTIs can be treated with medication. Because most UTIs are caused by a bacterial infection, they usually can be treated with the use of antibiotics. The choice of antibiotic and length of treatment depend on your symptoms and the type of bacteria causing your infection. HOME CARE INSTRUCTIONS  If you were prescribed antibiotics, take them exactly as your caregiver  instructs you. Finish the medication even if you feel better after you have only taken some of the medication.  Drink enough water and fluids to keep your urine clear or pale yellow.  Avoid caffeine, tea, and carbonated beverages. They tend to irritate your bladder.  Empty your bladder often. Avoid holding urine for long periods of time.  Empty your bladder before and after sexual intercourse.  After a bowel movement, women should cleanse from front to back. Use each tissue only once. SEEK MEDICAL CARE IF:   You have back pain.  You develop a fever.  Your symptoms do not begin to resolve within 3 days. SEEK IMMEDIATE MEDICAL CARE IF:   You have severe back pain or lower abdominal pain.  You develop chills.  You have nausea or vomiting.  You have continued burning or discomfort with urination. MAKE SURE YOU:   Understand these instructions.  Will watch your condition.  Will get help right away if you are not doing well or get worse. Document Released: 09/18/2005 Document Revised: 06/09/2012 Document Reviewed: 01/17/2012 [facility] Patient Information 2014 [place], [place].

## 2013-08-18 LAB — URINALYSIS
Bilirubin Urine: NEGATIVE
Ketones, ur: NEGATIVE mg/dL
Specific Gravity, Urine: 1.008 (ref 1.005–1.030)
pH: 6.5 (ref 5.0–8.0)

## 2013-08-18 LAB — URINE CULTURE: Organism ID, Bacteria: NO GROWTH

## 2013-08-18 NOTE — Telephone Encounter (Signed)
Called patient talked with his wife gave her a sooner appointment. Patient has increased tremor and memory loss.

## 2013-08-22 ENCOUNTER — Encounter: Payer: Self-pay | Admitting: Family Medicine

## 2013-08-22 DIAGNOSIS — R972 Elevated prostate specific antigen [PSA]: Secondary | ICD-10-CM

## 2013-08-22 DIAGNOSIS — E782 Mixed hyperlipidemia: Secondary | ICD-10-CM | POA: Insufficient documentation

## 2013-08-22 DIAGNOSIS — E785 Hyperlipidemia, unspecified: Secondary | ICD-10-CM

## 2013-08-22 HISTORY — DX: Elevated prostate specific antigen (PSA): R97.20

## 2013-08-22 HISTORY — DX: Hyperlipidemia, unspecified: E78.5

## 2013-08-22 NOTE — Assessment & Plan Note (Signed)
Was mildly elevated and referred to urology and then developed a secondary infection and his psa spiked above 10, he was treated for UTI and feels better. Is scheduled for prostate biopsy soon.

## 2013-08-22 NOTE — Progress Notes (Signed)
Patient ID: Brandon Francesconi., male   DOB: [birth year], 72 y.o.   MRN: [id number] Brandon Young [id number] 07-20-1941 08/22/2013      Progress Note-Follow Up  Subjective  Chief Complaint  Chief Complaint  Patient presents with  . Follow-up    HPI  Patient is a 72 year old Caucasian male who is in today for followup. He is following closely with urology at this time do to recent infection. He had a UTI and his PSA spike that. He was treated and his symptoms are improved but he is awaiting prostate biopsy. He stopped his statin 2 months ago thinking it might help his memory but it has not. No other acute complaints. No chest pain, palpitations, shortness of breath, GI complaints.  Past Medical History  Diagnosis Date  . Asthma     childhood- age 43  . History of chicken pox     childhood  . Ulcer of abdomen wall 1988  . History of blood transfusion 1988  . Acute bronchitis 04/14/2013  . Dementia   . COPD (chronic obstructive pulmonary disease) 05/02/2013  . Elevated PSA 08/22/2013  . Other and unspecified hyperlipidemia 08/22/2013    Past Surgical History  Procedure Laterality Date  . Abdominal surgery  1988    bleed ulcer  . Tonsillectomy    . Hernia repair  2013    Family History  Problem Relation Age of Onset  . Colon cancer Neg Hx   . Heart disease Neg Hx   . Prostate cancer Neg Hx   . Breast cancer Neg Hx   . Diabetes Neg Hx   . Hypertension Mother   . Dementia Mother   . Other Father     GI infection/disturbance  . Tremor Father   . COPD Sister     smoker  . Tremor Sister   . Tremor Brother   . Tremor Sister     History   Social History  . Marital Status: Married    Spouse Name: N/A    Number of Children: 2  . Years of Education: MED   Occupational History  . Not on file.   Social History Main Topics  . Smoking status: Former Games developer  . Smokeless tobacco: Never Used     Comment: 1 ppd for 30 years  . Alcohol Use: Yes     Comment: wine   . Drug Use: No  . Sexual Activity: Yes   Other Topics Concern  . Not on file   Social History Narrative  . No narrative on file    Current Outpatient Prescriptions on File Prior to Visit  Medication Sig Dispense Refill  . albuterol (PROVENTIL HFA;VENTOLIN HFA) 108 (90 BASE) MCG/ACT inhaler Inhale 2 puffs into the lungs every 6 (six) hours as needed for wheezing or shortness of breath.  1 Inhaler  5  . aspirin 81 MG tablet Take 81 mg by mouth daily.      Marland Kitchen CIALIS 20 MG tablet TAKE 1 TABLET BY MOUTH EVERY 36 HOURS AS NEEDED  5 tablet  3  . donepezil (ARICEPT) 10 MG tablet Take 2 tablets (20 mg total) by mouth at bedtime.      . Multiple Vitamins-Minerals (CENTRUM SILVER PO) Take by mouth daily.      . ondansetron (ZOFRAN-ODT) 4 MG disintegrating tablet Take 4 mg by mouth every 8 (eight) hours as needed for nausea.      . propranolol (INDERAL) 40 MG tablet Take 40 mg by mouth. Take  2 tablets by mouth twice a day      . tiotropium (SPIRIVA HANDIHALER) 18 MCG inhalation capsule Place 1 capsule (18 mcg total) into inhaler and inhale daily.  30 capsule  3   No current facility-administered medications on file prior to visit.    No Known Allergies  Review of Systems  Review of Systems  Constitutional: Negative for fever and malaise/fatigue.  HENT: Negative for congestion.   Eyes: Negative for discharge.  Respiratory: Negative for shortness of breath.   Cardiovascular: Negative for chest pain, palpitations and leg swelling.  Gastrointestinal: Negative for nausea, abdominal pain and diarrhea.  Genitourinary: Positive for urgency and frequency. Negative for dysuria.  Musculoskeletal: Negative for falls.  Skin: Negative for rash.  Neurological: Negative for loss of consciousness and headaches.  Endo/Heme/Allergies: Negative for polydipsia.  Psychiatric/Behavioral: Positive for memory loss. Negative for depression and suicidal ideas. The patient is not nervous/anxious and does not have  insomnia.     Objective  BP 100/78  Pulse 54  Temp(Src) 97.6 F (36.4 C) (Oral)  Ht 5\' 10"  (1.778 m)  Wt 145 lb 1.3 oz (65.808 kg)  BMI 20.82 kg/m2  SpO2 95%  Physical Exam  Physical Exam  Constitutional: He is oriented to person, place, and time and well-developed, well-nourished, and in no distress. No distress.  HENT:  Head: Normocephalic and atraumatic.  Eyes: Conjunctivae are normal.  Neck: Neck supple. No thyromegaly present.  Cardiovascular: Normal rate, regular rhythm and normal heart sounds.   No murmur heard. Pulmonary/Chest: Effort normal and breath sounds normal. No respiratory distress.  Abdominal: He exhibits no distension and no mass. There is no tenderness.  Musculoskeletal: He exhibits no edema.  Neurological: He is alert and oriented to person, place, and time.  Skin: Skin is warm.  Psychiatric: Memory, affect and judgment normal.    Lab Results  Component Value Date   TSH 2.906 04/30/2013   Lab Results  Component Value Date   WBC 6.8 08/17/2013   HGB 16.3 08/17/2013   HCT 47.7 08/17/2013   MCV 90.7 08/17/2013   PLT 231 08/17/2013   Lab Results  Component Value Date   CREATININE 0.91 08/17/2013   BUN 15 08/17/2013   NA 138 08/17/2013   K 5.0 08/17/2013   CL 102 08/17/2013   CO2 32 08/17/2013   Lab Results  Component Value Date   ALT 29 08/17/2013   AST 29 08/17/2013   ALKPHOS 89 08/17/2013   BILITOT 1.0 08/17/2013   Lab Results  Component Value Date   CHOL 199 08/17/2013   Lab Results  Component Value Date   HDL 61 08/17/2013   Lab Results  Component Value Date   LDLCALC 121* 08/17/2013   Lab Results  Component Value Date   TRIG 84 08/17/2013   Lab Results  Component Value Date   CHOLHDL 3.3 08/17/2013     Assessment & Plan  Elevated PSA Was mildly elevated and referred to urology and then developed a secondary infection and his psa spiked above 10, he was treated for UTI and feels better. Is scheduled for prostate biopsy soon.   COPD  (chronic obstructive pulmonary disease) No recent worsening of symptoms  Hyperglycemia Encouraged to avoid simple carbs and remain as active as possible  Other and unspecified hyperlipidemia Patient stopped his Atorvastatin  2 months ago hoping his memory was being made worse due to med. Unfortunately his memory did not improve so he agrees to restart Atorvastatin.

## 2013-08-22 NOTE — Assessment & Plan Note (Signed)
No recent worsening of symptoms

## 2013-08-22 NOTE — Assessment & Plan Note (Signed)
Encouraged to avoid simple carbs and remain as active as possible

## 2013-08-22 NOTE — Assessment & Plan Note (Signed)
Patient stopped his Atorvastatin  2 months ago hoping his memory was being made worse due to med. Unfortunately his memory did not improve so he agrees to restart Atorvastatin.

## 2013-08-25 ENCOUNTER — Encounter: Payer: Self-pay | Admitting: Nurse Practitioner

## 2013-08-25 ENCOUNTER — Ambulatory Visit (INDEPENDENT_AMBULATORY_CARE_PROVIDER_SITE_OTHER): Payer: Medicare PPO | Admitting: Nurse Practitioner

## 2013-08-25 VITALS — BP 138/83 | HR 49 | Ht 70.0 in | Wt 145.0 lb

## 2013-08-25 DIAGNOSIS — G250 Essential tremor: Secondary | ICD-10-CM

## 2013-08-25 DIAGNOSIS — R453 Demoralization and apathy: Secondary | ICD-10-CM

## 2013-08-25 DIAGNOSIS — F03A0 Unspecified dementia, mild, without behavioral disturbance, psychotic disturbance, mood disturbance, and anxiety: Secondary | ICD-10-CM

## 2013-08-25 DIAGNOSIS — F0390 Unspecified dementia without behavioral disturbance: Secondary | ICD-10-CM

## 2013-08-25 MED ORDER — ESCITALOPRAM OXALATE 10 MG PO TABS: 10.0000 mg | ORAL_TABLET | Freq: Every day | ORAL | Status: DC

## 2013-08-25 MED ORDER — PRIMIDONE 50 MG PO TABS: 50.0000 mg | ORAL_TABLET | Freq: Every day | ORAL | Status: DC

## 2013-08-25 NOTE — Progress Notes (Signed)
[facility]  PATIENT: Brandon Young. DOB: 12-13-1941   HISTORY FROM: patient, wife REASON FOR VISIT: follow up memory, tremor   HISTORICAL  CHIEF COMPLAINT:  Chief Complaint  Patient presents with  . Follow-up    Tremors,memory loss #14    HISTORY OF PRESENT ILLNESS: 72 y.o. right handed Caucasian male referred by [name], MD for evaluation and treatment of cognitive problems. He is accompanied by spouse. He is currently a patient of [name], Neurology in [place]. Patient is able to do his own ADLs. The family and the patient identify problems with changes in short term memory. Wife states she started noticing problems with short term memory 6 years ago. Family and patient report problems with disinterest in activities that he used to enjoy such as golf, which started about 3 years ago. Family and patient are concerned about progression of the memory loss and want a 2nd opinion of treatment options. Medication administration: wife monitors medication usage Patient is driving with wife as passenger only. Patient denies visual and sensory hallucinations. Denies change in appetite. Patient has been taking Donepezil for approximately a year and a half, now taking 10mg  twice daily with no side effects.   UPDATE 08/25/13 (LL): Patient returns for follow up, wife had called for sooner appointment.  States tremor and memory are getting worse.  She states he is moody, has no interest in activities they used to do (eg. Going to [employer] with friends, taking trips with her, golf) and it is getting worse.  He states that he just has no interest in doing those things anymore but he still golfs twice a week.  Had recent problem with UTI and urinary retention, 2 trips to the ER, psa elevated, set for biopsy on Oct 1.  Tremor makes it increasingly difficult to feed himself and drink without spilling.  Drinks 2 glasses of red wine daily before dinner which helps calm tremors  down.  Functional Assessment:  Activities of Daily Living (ADLs):  He is independent in the following: ambulation, bathing and hygiene, feeding, continence, grooming, toileting and dressing Requires assistance with the following: medication administration.  Outside reports reviewed: historical medical records, imaging reports: normal for age MRI, lab reports and referral letter/letters.  The following portions of the patient's history were reviewed and updated as appropriate: allergies, current medications, past family history, past medical history, past social history, past surgical history and problem list.  Review of Systems  Constitutional: positive for feeling cold  Hematologic/lymphatic: positive for easy bruising  Neurological: positive for memory problems and confusion  Behavioral/Psych: positive for loss of interest in favorite activities and decreased energy  Endocrine: positive for temperature intolerance  Allergic/Immunologic: allergies, runny nose   ALLERGIES: No Known Allergies  HOME MEDICATIONS: Outpatient Prescriptions Prior to Visit  Medication Sig Dispense Refill  . albuterol (PROVENTIL HFA;VENTOLIN HFA) 108 (90 BASE) MCG/ACT inhaler Inhale 2 puffs into the lungs every 6 (six) hours as needed for wheezing or shortness of breath.  1 Inhaler  5  . aspirin 81 MG tablet Take 81 mg by mouth daily.      Marland Kitchen atorvastatin (LIPITOR) 10 MG tablet Take 1 tablet (10 mg total) by mouth daily.  90 tablet  3  . CIALIS 20 MG tablet TAKE 1 TABLET BY MOUTH EVERY 36 HOURS AS NEEDED  5 tablet  3  . donepezil (ARICEPT) 10 MG tablet Take 23 mg by mouth at bedtime.       Marland Kitchen  Multiple Vitamins-Minerals (CENTRUM SILVER PO) Take by mouth daily.      . ondansetron (ZOFRAN-ODT) 4 MG disintegrating tablet Take 4 mg by mouth every 8 (eight) hours as needed for nausea.      . propranolol (INDERAL) 40 MG tablet Take 40 mg by mouth. Take 2 tablets by mouth twice a day      . tamsulosin (FLOMAX) 0.4 MG  CAPS capsule Take 0.4 mg by mouth daily.      . finasteride (PROSCAR) 5 MG tablet       . tiotropium (SPIRIVA HANDIHALER) 18 MCG inhalation capsule Place 1 capsule (18 mcg total) into inhaler and inhale daily.  30 capsule  3   No facility-administered medications prior to visit.    PAST MEDICAL HISTORY: Past Medical History  Diagnosis Date  . Asthma     childhood- age 36  . History of chicken pox     childhood  . Ulcer of abdomen wall 1988  . History of blood transfusion 1988  . Acute bronchitis 04/14/2013  . Dementia   . COPD (chronic obstructive pulmonary disease) 05/02/2013  . Elevated PSA 08/22/2013  . Other and unspecified hyperlipidemia 08/22/2013    PAST SURGICAL HISTORY: Past Surgical History  Procedure Laterality Date  . Abdominal surgery  1988    bleed ulcer  . Tonsillectomy    . Hernia repair  2013    FAMILY HISTORY: Family History  Problem Relation Age of Onset  . Colon cancer Neg Hx   . Heart disease Neg Hx   . Prostate cancer Neg Hx   . Breast cancer Neg Hx   . Diabetes Neg Hx   . Hypertension Mother   . Dementia Mother   . Other Father     GI infection/disturbance  . Tremor Father   . COPD Sister     smoker  . Tremor Sister   . Tremor Brother   . Tremor Sister     SOCIAL HISTORY: History   Social History  . Marital Status: Married    Spouse Name: N/A    Number of Children: 2  . Years of Education: MED   Occupational History  . Not on file.   Social History Main Topics  . Smoking status: Former Games developer  . Smokeless tobacco: Never Used     Comment: 1 ppd for 30 years  . Alcohol Use: Yes     Comment: wine  . Drug Use: No  . Sexual Activity: Yes   Other Topics Concern  . Not on file   Social History Narrative  . No narrative on file     PHYSICAL EXAM  Filed Vitals:   08/25/13 1035  BP: 138/83  Pulse: 49  Height: 5\' 10"  (1.778 m)  Weight: 145 lb (65.772 kg)   Body mass index is 20.81 kg/(m^2).  GENERAL EXAM:  Patient is  in no distress, well developed, well groomed  CARDIOVASCULAR:  Regular rate and rhythm, no murmurs, no carotid bruits   NEUROLOGIC:  MENTAL STATUS: awake, alert, language fluent, comprehension intact; MMSE 25/30 with defivits in orientation to time, season, building, RECALL 1/3, Clock drawing 3/4. AFT 10.  BORDERLINE MYERSON. NEG SNOUT. NEG PALMOMENTAL. (Last MMSE 25/30 on 05/18/13) CRANIAL NERVE: no papilledema on fundoscopic exam, pupils equal and reactive to light, visual fields full to confrontation, extraocular muscles intact, no nystagmus, facial sensation and strength symmetric, uvula midline, shoulder shrug symmetric, tongue midline, STAINED BLACK FROM WINE. MOTOR: normal bulk and tone, full strength in the  BUE, BLE, fine finger movements normal. mixed tremor: resting tremor of head and left hand, intention tremor in right hand.  SENSORY: normal and symmetric to light touch, pinprick, temperature, vibration  COORDINATION: finger-nose-finger  REFLEXES: deep tendon reflexes present and symmetric  GAIT/STATION: narrow based gait; able to walk on toes, heels and tandem; romberg is negative. Decreased arm swing when walking. Borderline Myerson's sign.   DIAGNOSTIC DATA (LABS, IMAGING, TESTING) - I reviewed patient records, labs, notes, testing and imaging myself where available.  Lab Results  Component Value Date   WBC 6.8 08/17/2013   HGB 16.3 08/17/2013   HCT 47.7 08/17/2013   MCV 90.7 08/17/2013   PLT 231 08/17/2013      Component Value Date/Time   NA 138 08/17/2013 1106   K 5.0 08/17/2013 1106   CL 102 08/17/2013 1106   CO2 32 08/17/2013 1106   GLUCOSE 109* 08/17/2013 1106   BUN 15 08/17/2013 1106   CREATININE 0.91 08/17/2013 1106   CREATININE 1.00 05/22/2013 1010   CALCIUM 9.4 08/17/2013 1106   PROT 6.6 08/17/2013 1106   ALBUMIN 4.2 08/17/2013 1106   ALBUMIN 4.2 08/17/2013 1106   AST 29 08/17/2013 1106   ALT 29 08/17/2013 1106   ALKPHOS 89 08/17/2013 1106   BILITOT 1.0 08/17/2013 1106    GFRNONAA 73* 05/22/2013 1010   GFRAA 85* 05/22/2013 1010   Lab Results  Component Value Date   CHOL 199 08/17/2013   HDL 61 08/17/2013   LDLCALC 121* 08/17/2013   TRIG 84 08/17/2013   CHOLHDL 3.3 08/17/2013   Lab Results  Component Value Date   HGBA1C 5.3 04/30/2013   Lab Results  Component Value Date   VITAMINB12 738 03/31/2012   Lab Results  Component Value Date   TSH 2.906 04/30/2013   MRI Brain Without 08/13 Normal for age noncontrast MRI appearance of the brain.  ASSESSMENT AND PLAN 72 y.o. year old male has a past medical history of Asthma; History of chicken pox; Ulcer of abdomen wall (1988); History of blood transfusion (1988); Acute bronchitis (04/14/2013); Dementia; and COPD (chronic obstructive pulmonary disease) (05/02/2013). here for evaluation of memory loss. 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 Discussed progressive course of the disease and treatment options.   1. Discussed Exped[facility] [employer] clinical study, Lorenza Burton, Designer, industrial/product will speak to patient and wife and provide information.  Had to cancel last appointment due to UTI. 2. Continue current dose of Donepizil, 23 mg BID.  If patient not enrolled in clinical study, will consider adding Namenda at next visit.  3. Start Primidone 50 mg daily at bedtime.    Week 1: Take 1/2 tablet. 25 mg  Week 2 Take 1 tablet  (50mg )  Week 3: If needed, take 1 1/2 tablet. 75 mg  Week 4: If needed, take 2 tablets 100 mg total. 4. Reduce Propranolol to 1 tablet twice a day. 5. Start Lexapro 10 mg in 2 weeks after starting Primidone.  Take 1 tablet daily.  It may take 3-4 weeks to see a change in mood. 6. Refer for Neuropsychological testing. 7. Follow up in 1 month with Heide Guile, NP.   Orders Placed This Encounter  Procedures  . Ambulatory referral to Neuropsychology   Meds ordered this encounter  Medications  . escitalopram  (LEXAPRO) 10 MG tablet    Sig: Take 1 tablet (10 mg total) by mouth daily.  Dispense:  30 tablet    Refill:  1    Order Specific Question:  Supervising Provider    Answer:  Joycelyn Schmid R [[id number]]  . primidone (MYSOLINE) 50 MG tablet    Sig: Take 1 tablet (50 mg total) by mouth at bedtime.    Dispense:  60 tablet    Refill:  1    Order Specific Question:  Supervising Provider    Answer:  Suanne Marker [[id number]]   Discussed treatment plan with Dr. Joycelyn Schmid, MD  LYNN LAM NP-C 08/25/2013, 11:54 AM  [facility] [address] [place], [place] [address] [phone]

## 2013-08-25 NOTE — Patient Instructions (Addendum)
Reduce Propranolol to 1 tablet twice a day.  Start Primidone 50 mg daily at bedtime.   Week 1: Take 1/2 tablet. 25 mg Week 2 Take 1 tablet  (50mg ) Week 3: If needed, take 1 1/2 tablet. 75 mg Week 4: If needed, take 2 tablets 100 mg total.  Start Lexapro 10 mg in 2 weeks after starting Primidone.  Take 1 tablet daily.  It may take 3-4 weeks to see a change in mood.  Return for follow up in 1 month.  At that time we may adjust Primidone dose and start Namenda.

## 2013-09-01 NOTE — Progress Notes (Signed)
I reviewed note and agree with plan.   VIKRAM R. PENUMALLI, MD 09/01/2013, 1:34 PM Certified in Neurology, Neurophysiology and Neuroimaging  [facility] [address], [address] [place], [place] [address] [phone]  

## 2013-09-27 ENCOUNTER — Encounter: Payer: Self-pay | Admitting: Nurse Practitioner

## 2013-09-27 ENCOUNTER — Ambulatory Visit (INDEPENDENT_AMBULATORY_CARE_PROVIDER_SITE_OTHER): Payer: Medicare PPO | Admitting: Nurse Practitioner

## 2013-09-27 VITALS — BP 101/63 | HR 52 | Temp 97.8°F | Ht 67.5 in | Wt 147.0 lb

## 2013-09-27 DIAGNOSIS — F03A0 Unspecified dementia, mild, without behavioral disturbance, psychotic disturbance, mood disturbance, and anxiety: Secondary | ICD-10-CM

## 2013-09-27 DIAGNOSIS — F0390 Unspecified dementia without behavioral disturbance: Secondary | ICD-10-CM

## 2013-09-27 DIAGNOSIS — G250 Essential tremor: Secondary | ICD-10-CM

## 2013-09-27 DIAGNOSIS — R453 Demoralization and apathy: Secondary | ICD-10-CM

## 2013-09-27 MED ORDER — PRIMIDONE 50 MG PO TABS: 150.0000 mg | ORAL_TABLET | Freq: Every day | ORAL | Status: DC

## 2013-09-27 MED ORDER — ESCITALOPRAM OXALATE 10 MG PO TABS: 10.0000 mg | ORAL_TABLET | Freq: Every day | ORAL | Status: DC

## 2013-09-27 MED ORDER — MEMANTINE HCL ER 7 & 14 & 21 &28 MG PO CP24: 1.0000 | ORAL_CAPSULE | Freq: Every day | ORAL | Status: DC

## 2013-09-27 NOTE — Progress Notes (Signed)
[facility]  PATIENT: Brandon Young. DOB: August 26, 1941   REASON FOR VISIT: follow up HISTORY FROM: patient  72 y.o. right handed Caucasian male referred by [name], MD for evaluation and treatment of cognitive problems. He is accompanied by spouse. He is currently a patient of [name], Neurology in [place]. Patient is able to do his own ADLs. The family and the patient identify problems with changes in short term memory. Wife states she started noticing problems with short term memory 6 years ago. Family and patient report problems with disinterest in activities that he used to enjoy such as golf, which started about 3 years ago. Family and patient are concerned about progression of the memory loss and want a 2nd opinion of treatment options. Medication administration: wife monitors medication usage Patient is driving with wife as passenger only. Patient denies visual and sensory hallucinations. Denies change in appetite. Patient has been taking Donepezil for approximately a year and a half, now taking 10mg  twice daily with no side effects.   UPDATE 08/25/13 (LL): Patient returns for follow up, wife had called for sooner appointment. States tremor and memory are getting worse. She states he is moody, has no interest in activities they used to do (eg. Going to [employer] with friends, taking trips with her, golf) and it is getting worse. He states that he just has no interest in doing those things anymore but he still golfs twice a week. Had recent problem with UTI and urinary retention, 2 trips to the ER, psa elevated, set for biopsy on Oct 1. Tremor makes it increasingly difficult to feed himself and drink without spilling. Drinks 2 glasses of red wine daily before dinner which helps calm tremors down.   UPDATE 09/27/13 (LL): Patient and wife return for follow up.  Wife reports that she thinks Lexapro may be helping her husbands mood, no side effects noted.  Good  response from adding Primidone at first, but now "back to like it was before." No new complaints.  Wood like to add [employer], wife asks about adding Targretin for memory, knows someone on it.   Functional Assessment:  Activities of Daily Living (ADLs):  He is independent in the following: ambulation, bathing and hygiene, feeding, continence, grooming, toileting and dressing Requires assistance with the following: medication administration.  Outside reports reviewed: historical medical records, imaging reports: normal for age MRI, lab reports and referral letter/letters.  The following portions of the patient's history were reviewed and updated as appropriate: allergies, current medications, past family history, past medical history, past social history, past surgical history and problem list.  Review of Systems  Constitutional: positive for feeling cold  Hematologic/lymphatic: positive for easy bruising  Neurological: positive for memory problems and confusion  Behavioral/Psych: positive for loss of interest in favorite activities and decreased energy  Endocrine: positive for temperature intolerance  Allergic/Immunologic: allergies, runny nose    ALLERGIES: No Known Allergies  HOME MEDICATIONS: Outpatient Prescriptions Prior to Visit  Medication Sig Dispense Refill  . albuterol (PROVENTIL HFA;VENTOLIN HFA) 108 (90 BASE) MCG/ACT inhaler Inhale 2 puffs into the lungs every 6 (six) hours as needed for wheezing or shortness of breath.  1 Inhaler  5  . aspirin 81 MG tablet Take 81 mg by mouth daily.      Marland Kitchen atorvastatin (LIPITOR) 10 MG tablet Take 1 tablet (10 mg total) by mouth daily.  90 tablet  3  . CIALIS 20 MG tablet TAKE 1 TABLET BY MOUTH EVERY  36 HOURS AS NEEDED  5 tablet  3  . donepezil (ARICEPT) 10 MG tablet Take 23 mg by mouth at bedtime.       . Multiple Vitamins-Minerals (CENTRUM SILVER PO) Take by mouth daily.      . ondansetron (ZOFRAN-ODT) 4 MG disintegrating tablet Take 4 mg by  mouth every 8 (eight) hours as needed for nausea.      . tamsulosin (FLOMAX) 0.4 MG CAPS capsule Take 0.4 mg by mouth daily.      Marland Kitchen escitalopram (LEXAPRO) 10 MG tablet Take 1 tablet (10 mg total) by mouth daily.  30 tablet  1  . primidone (MYSOLINE) 50 MG tablet Take 1 tablet (50 mg total) by mouth at bedtime.  60 tablet  1  . propranolol (INDERAL) 40 MG tablet Take 40 mg by mouth 2 (two) times daily.        No facility-administered medications prior to visit.    PAST MEDICAL HISTORY: Past Medical History  Diagnosis Date  . Asthma     childhood- age 27  . History of chicken pox     childhood  . Ulcer of abdomen wall 1988  . History of blood transfusion 1988  . Acute bronchitis 04/14/2013  . Dementia   . COPD (chronic obstructive pulmonary disease) 05/02/2013  . Elevated PSA 08/22/2013  . Other and unspecified hyperlipidemia 08/22/2013    PAST SURGICAL HISTORY: Past Surgical History  Procedure Laterality Date  . Abdominal surgery  1988    bleed ulcer  . Tonsillectomy    . Hernia repair  2013    FAMILY HISTORY: Family History  Problem Relation Age of Onset  . Colon cancer Neg Hx   . Heart disease Neg Hx   . Prostate cancer Neg Hx   . Breast cancer Neg Hx   . Diabetes Neg Hx   . Hypertension Mother   . Dementia Mother   . Other Father     GI infection/disturbance  . Tremor Father   . COPD Sister     smoker  . Tremor Sister   . Tremor Brother   . Tremor Sister     SOCIAL HISTORY: History   Social History  . Marital Status: Married    Spouse Name: martha    Number of Children: 2  . Years of Education: MED   Occupational History  . retired    Social History Main Topics  . Smoking status: Former Games developer  . Smokeless tobacco: Never Used     Comment: 1 ppd for 30 years  . Alcohol Use: Yes     Comment: wine  . Drug Use: No  . Sexual Activity: Yes   Other Topics Concern  . Not on file   Social History Narrative  . No narrative on file     PHYSICAL  EXAM  Filed Vitals:   09/27/13 1108  BP: 101/63  Pulse: 52  Temp: 97.8 F (36.6 C)  TempSrc: Oral  Height: 5' 7.5" (1.715 m)  Weight: 147 lb (66.679 kg)   Body mass index is 22.67 kg/(m^2).  GENERAL EXAM:  Patient is in no distress, well developed, well groomed  CARDIOVASCULAR:  Regular rate and rhythm, no murmurs, no carotid bruits  NEUROLOGIC:  MENTAL STATUS: awake, alert, language fluent, comprehension intact (Last visit  MMSE 25/30 with deficits in orientation to time, season, building, RECALL 1/3, Clock drawing 3/4. AFT 10. ) BORDERLINE MYERSON. NEG SNOUT. NEG PALMOMENTAL. CRANIAL NERVE: no papilledema on fundoscopic exam, pupils equal and  reactive to light, visual fields full to confrontation, extraocular muscles intact, no nystagmus, facial sensation and strength symmetric, uvula midline, shoulder shrug symmetric, tongue midline, STAINED BLACK FROM WINE.  MOTOR: normal bulk and tone, full strength in the BUE, BLE, fine finger movements normal. mixed tremor: resting tremor of head and left hand, coarse intention tremor in right hand.  SENSORY: normal and symmetric to light touch, pinprick, temperature, vibration  COORDINATION: finger-nose-finger with tremor REFLEXES: deep tendon reflexes present and symmetric  GAIT/STATION: narrow based gait; able to walk on toes, heels and tandem; romberg is negative. Decreased arm swing when walking.   DIAGNOSTIC DATA (LABS, IMAGING, TESTING) - I reviewed patient records, labs, notes, testing and imaging myself where available.  Lab Results  Component Value Date   WBC 6.8 08/17/2013   HGB 16.3 08/17/2013   HCT 47.7 08/17/2013   MCV 90.7 08/17/2013   PLT 231 08/17/2013      Component Value Date/Time   NA 138 08/17/2013 1106   K 5.0 08/17/2013 1106   CL 102 08/17/2013 1106   CO2 32 08/17/2013 1106   GLUCOSE 109* 08/17/2013 1106   BUN 15 08/17/2013 1106   CREATININE 0.91 08/17/2013 1106   CREATININE 1.00 05/22/2013 1010   CALCIUM 9.4  08/17/2013 1106   PROT 6.6 08/17/2013 1106   ALBUMIN 4.2 08/17/2013 1106   ALBUMIN 4.2 08/17/2013 1106   AST 29 08/17/2013 1106   ALT 29 08/17/2013 1106   ALKPHOS 89 08/17/2013 1106   BILITOT 1.0 08/17/2013 1106   GFRNONAA 73* 05/22/2013 1010   GFRAA 85* 05/22/2013 1010   Lab Results  Component Value Date   CHOL 199 08/17/2013   HDL 61 08/17/2013   LDLCALC 121* 08/17/2013   TRIG 84 08/17/2013   CHOLHDL 3.3 08/17/2013   Lab Results  Component Value Date   HGBA1C 5.3 04/30/2013   Lab Results  Component Value Date   VITAMINB12 738 03/31/2012   Lab Results  Component Value Date   TSH 2.906 04/30/2013    ASSESSMENT AND PLAN 72 y.o. year old male has a past medical history of Asthma; History of chicken pox; Ulcer of abdomen wall (1988); History of blood transfusion (1988); Acute bronchitis (04/14/2013); Dementia; and COPD (chronic obstructive pulmonary disease) (05/02/2013). here for evaluation of memory loss. 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 Discussed progressive course of the disease and treatment options. Add Namenda today.  1. Continue current dose of Donepizil, 23 mg BID 2. Increase Primidone to 150 mg, 3 tablets daily at bedtime.  3. Reduce Propranolol to 1 tablet once a day for a week, then 1 every other day for 1 week then stop. 4. Start Namenda XR, titration package, follow package directions.  When almost complete, call for prescription for 28 mg dose. 5. Continue Lexapro 10 mg daily. 6. Follow up in 3 months.  Meds ordered this encounter  Medications  . DISCONTD: primidone (MYSOLINE) 50 MG tablet    Sig: Take 50 mg by mouth 2 (two) times daily. Take at bedtime  . primidone (MYSOLINE) 50 MG tablet    Sig: Take 3 tablets (150 mg total) by mouth at bedtime. Take at bedtime    Dispense:  270 tablet    Refill:  3    Order Specific Question:  Supervising Provider    Answer:   Joycelyn Schmid R [[id number]]  . escitalopram (LEXAPRO) 10 MG tablet  Sig: Take 1 tablet (10 mg total) by mouth daily.    Dispense:  90 tablet    Refill:  3    Order Specific Question:  Supervising Provider    Answer:  Joycelyn Schmid R [[id number]]  . Memantine HCl ER (NAMENDA XR TITRATION PACK) 7 & 14 & 21 &28 MG CP24    Sig: Take 1 capsule by mouth daily.    Dispense:  28 capsule    Refill:  0    Order Specific Question:  Supervising Provider    Answer:  Joycelyn Schmid R [[id number]]    Tawny Asal LAM, MSN, NP-C 09/27/2013, 2:35 PM [facility] [address] [place], [place] [address] [phone]

## 2013-09-27 NOTE — Patient Instructions (Addendum)
Continue current dose of Donepizil, 23 mg BID  Continue Primidone 150 mg 3 tablets daily at bedtime.   Reduce Propranolol to 1 tablet once a day for a week, then 1 every other day for 1 week then stop.  Start Namenda XR, titration package, follow package directions.  When almost complete, call for prescription for 28 mg dose.  Continue Lexapro 10 mg daily.  Follow up in 3 months.

## 2013-09-30 ENCOUNTER — Telehealth: Payer: Self-pay | Admitting: *Deleted

## 2013-09-30 NOTE — Telephone Encounter (Signed)
That is ok with me.  Give them a starter pack of regular Namenda.

## 2013-09-30 NOTE — Telephone Encounter (Signed)
Returning call after wife LMVM re: medication prescribed here, Namenda that not available at the pharmacy.  I called and LMVM that I returned call. Can pt get the namenda 10mg  po bid if available?

## 2013-09-30 NOTE — Telephone Encounter (Signed)
I did speak with wife about the namenda.    Two different pharmacies did not have the namenda XR.  I told her that can give her a starter pack of regular namenda and if at 3 wks to call and see if her pharmacy has the other for her husband.  I would ask Larita Fife Lam/ NP if ok with this and then LM for her on home phone.

## 2013-10-01 NOTE — Telephone Encounter (Signed)
Called and they will p/u Namenda starter pack.

## 2013-10-06 ENCOUNTER — Other Ambulatory Visit: Payer: Self-pay

## 2013-10-06 MED ORDER — MEMANTINE HCL 10 MG PO TABS: 10.0000 mg | ORAL_TABLET | Freq: Two times a day (BID) | ORAL | Status: DC

## 2013-10-06 NOTE — Telephone Encounter (Signed)
Pharmacy is unable to get Namenda XR.  They have requested a Rx for regular Namenda until XR becomes available.

## 2013-10-11 NOTE — Progress Notes (Signed)
I reviewed note and agree with plan.   Suanne Marker, MD 10/11/2013, 11:13 PM Certified in Neurology, Neurophysiology and Neuroimaging  [facility] [address], [address] [place], [place] [address] [phone]

## 2013-10-19 ENCOUNTER — Other Ambulatory Visit: Payer: Self-pay | Admitting: Neurology

## 2013-10-28 ENCOUNTER — Other Ambulatory Visit: Payer: Self-pay

## 2013-11-22 ENCOUNTER — Ambulatory Visit: Payer: Medicare PPO | Admitting: Nurse Practitioner

## 2013-12-09 ENCOUNTER — Telehealth: Payer: Self-pay | Admitting: Diagnostic Neuroimaging

## 2013-12-09 NOTE — Telephone Encounter (Signed)
Rx was already sent when patient was in for OV.  I called patient, spoke with spouse.  She will follow up with the pharmacy.

## 2013-12-09 NOTE — Telephone Encounter (Signed)
Patient's wife called to state that her husband was given Namenda starter pack and they only have 5 more days left. Patient needs a script written. Patient's wife requesting a call when this has been sent in.

## 2013-12-13 ENCOUNTER — Ambulatory Visit: Payer: Medicare PPO | Admitting: Family Medicine

## 2013-12-13 ENCOUNTER — Telehealth: Payer: Self-pay | Admitting: Nurse Practitioner

## 2013-12-13 MED ORDER — MEMANTINE HCL ER 28 MG PO CP24: 28.0000 mg | ORAL_CAPSULE | Freq: Every day | ORAL | Status: DC

## 2013-12-13 NOTE — Telephone Encounter (Signed)
NEEDS RX CALLED IN FOR NAMENDA 28MG --WAS GIVEN STARTER KIT--HAS ONLY 2 DAYS LEFT--GOING OUT OF TOWN TOMORROW--[facility] [address] [place]

## 2013-12-13 NOTE — Telephone Encounter (Signed)
Perhaps the co-pay is high because they are in the donut hole?  I called back.  Spoke with Ms Casebolt.  She will download a voucher for one free month of Namenda from [email or web address] and take that to the pharmacy.  Says she isn't sure he is getting any better on the medication.  [facility] Spaniel they will use voucher to get the 30 day free trial, and she will call back after the holiday to let us know how he's doing.  Indicates she may want to discuss other options with provider when she calls back.

## 2013-12-13 NOTE — Telephone Encounter (Signed)
RX FOR NAMENDA WAS CALLED IN TO [facility] [address] [place]--RX COST $379.00 AND CANNOT AFFORD--WHAT CAN PATIENT DO? PLEASE CALL

## 2013-12-13 NOTE — Telephone Encounter (Signed)
Rx has been sent  

## 2013-12-14 ENCOUNTER — Encounter: Payer: Self-pay | Admitting: Family Medicine

## 2013-12-14 ENCOUNTER — Ambulatory Visit (INDEPENDENT_AMBULATORY_CARE_PROVIDER_SITE_OTHER): Payer: Medicare PPO | Admitting: Family Medicine

## 2013-12-14 VITALS — BP 112/80 | HR 76 | Temp 97.9°F | Ht 70.0 in | Wt 149.1 lb

## 2013-12-14 DIAGNOSIS — Z23 Encounter for immunization: Secondary | ICD-10-CM

## 2013-12-14 DIAGNOSIS — R739 Hyperglycemia, unspecified: Secondary | ICD-10-CM

## 2013-12-14 DIAGNOSIS — J309 Allergic rhinitis, unspecified: Secondary | ICD-10-CM

## 2013-12-14 DIAGNOSIS — J449 Chronic obstructive pulmonary disease, unspecified: Secondary | ICD-10-CM

## 2013-12-14 DIAGNOSIS — N529 Male erectile dysfunction, unspecified: Secondary | ICD-10-CM

## 2013-12-14 DIAGNOSIS — E785 Hyperlipidemia, unspecified: Secondary | ICD-10-CM

## 2013-12-14 DIAGNOSIS — R7309 Other abnormal glucose: Secondary | ICD-10-CM

## 2013-12-14 DIAGNOSIS — J300 Vasomotor rhinitis: Secondary | ICD-10-CM | POA: Insufficient documentation

## 2013-12-14 DIAGNOSIS — R110 Nausea: Secondary | ICD-10-CM

## 2013-12-14 DIAGNOSIS — R634 Abnormal weight loss: Secondary | ICD-10-CM

## 2013-12-14 HISTORY — DX: Vasomotor rhinitis: J30.0

## 2013-12-14 LAB — HEPATIC FUNCTION PANEL
AST: 38 U/L — ABNORMAL HIGH (ref 0–37)
Albumin: 4.1 g/dL (ref 3.5–5.2)
Alkaline Phosphatase: 104 U/L (ref 39–117)
Total Protein: 6.4 g/dL (ref 6.0–8.3)

## 2013-12-14 LAB — RENAL FUNCTION PANEL
BUN: 13 mg/dL (ref 6–23)
CO2: 30 mEq/L (ref 19–32)
Chloride: 101 mEq/L (ref 96–112)
Creat: 0.69 mg/dL (ref 0.50–1.35)

## 2013-12-14 LAB — LIPID PANEL
Cholesterol: 167 mg/dL (ref 0–200)
Triglycerides: 70 mg/dL (ref <150)

## 2013-12-14 LAB — CBC
Hemoglobin: 15.8 g/dL (ref 13.0–17.0)
MCH: 31.5 pg (ref 26.0–34.0)
MCHC: 34 g/dL (ref 30.0–36.0)
MCV: 92.8 fL (ref 78.0–100.0)

## 2013-12-14 LAB — HEMOGLOBIN A1C
Hgb A1c MFr Bld: 5.6 % (ref <5.7)
Mean Plasma Glucose: 114 mg/dL (ref <117)

## 2013-12-14 MED ORDER — AZELASTINE HCL 0.1 % NA SOLN: 2.0000 | Freq: Two times a day (BID) | NASAL | Status: DC

## 2013-12-14 MED ORDER — TETANUS-DIPHTH-ACELL PERTUSSIS 5-2.5-18.5 LF-MCG/0.5 IM SUSP: 0.5000 mL | Freq: Once | INTRAMUSCULAR | Status: DC

## 2013-12-14 NOTE — Assessment & Plan Note (Signed)
Will try Astelin nasal spray.

## 2013-12-14 NOTE — Progress Notes (Signed)
Pre visit review using our clinic review tool, if applicable. No additional management support is needed unless otherwise documented below in the visit note. 

## 2013-12-14 NOTE — Patient Instructions (Signed)
Ginger caps roughly 3 times daily with meals   Nausea, Adult Nausea is the feeling that you have an upset stomach or have to vomit. Nausea by itself is not likely a serious concern, but it may be an early sign of more serious medical problems. As nausea gets worse, it can lead to vomiting. If vomiting develops, there is the risk of dehydration.  CAUSES   Viral infections.  Food poisoning.  Medicines.  Pregnancy.  Motion sickness.  Migraine headaches.  Emotional distress.  Severe pain from any source.  Alcohol intoxication. HOME CARE INSTRUCTIONS  Get plenty of rest.  Ask your caregiver about specific rehydration instructions.  Eat small amounts of food and sip liquids more often.  Take all medicines as told by your caregiver. SEEK MEDICAL CARE IF:  You have not improved after 2 days, or you get worse.  You have a headache. SEEK IMMEDIATE MEDICAL CARE IF:   You have a fever.  You faint.  You keep vomiting or have blood in your vomit.  You are extremely weak or dehydrated.  You have dark or bloody stools.  You have severe chest or abdominal pain. MAKE SURE YOU:  Understand these instructions.  Will watch your condition.  Will get help right away if you are not doing well or get worse. Document Released: 01/16/2005 Document Revised: 09/02/2012 Document Reviewed: 08/21/2011 [facility] Patient Information 2014 [place], [place].

## 2013-12-19 NOTE — Progress Notes (Signed)
Patient ID: Brandon Goza., male   DOB: April 02, 1941, 72 y.o.   MRN: [id number] Brandon Young [id number] June 12, 1941 12/19/2013      Progress Note-Follow Up  Subjective  Chief Complaint  Chief Complaint  Patient presents with  . Follow-up    4 month  . Injections    today  . nose runs all the time    in the morning only- nauseas    HPI  Patient is a 72 year old male who is sitting for followup. Generally doing well but is complaining of persistent clear runny nose. Worse with hot meals and mass. No fevers or chills. No ear pain or throat. No chest pain, palpitations or shortness of breath. He is eating somewhat better. No GI or GU concerns. Agrees to Td today  Past Medical History  Diagnosis Date  . Asthma     childhood- age 52  . History of chicken pox     childhood  . Ulcer of abdomen wall 1988  . History of blood transfusion 1988  . Acute bronchitis 04/14/2013  . Dementia   . COPD (chronic obstructive pulmonary disease) 05/02/2013  . Elevated PSA 08/22/2013  . Other and unspecified hyperlipidemia 08/22/2013  . 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 Abdominal surgery  1988    bleed ulcer  . Tonsillectomy    . Hernia repair  2013    Family History  Problem Relation Age of Onset  . Colon cancer Neg Hx   . Heart disease Neg Hx   . Prostate cancer Neg Hx   . Breast cancer Neg Hx   . Diabetes Neg Hx   . Hypertension Mother   . Dementia Mother   . Other Father     GI infection/disturbance  . Tremor Father   . COPD Sister     smoker  . Tremor Sister   . Tremor Brother   . Tremor Sister     History   Social History  . Marital Status: Married    Spouse Name: martha    Number of Children: 2  . Years of Education: MED   Occupational History  . retired    Social History Main Topics  . Smoking status: Former Games developer  . Smokeless tobacco: Never Used     Comment: 1 ppd for 30 years  . Alcohol Use:  Yes     Comment: wine  . Drug Use: No  . Sexual Activity: Yes   Other Topics Concern  . Not on file   Social History Narrative  . No narrative on file    Current Outpatient Prescriptions on File Prior to Visit  Medication Sig Dispense Refill  . albuterol (PROVENTIL HFA;VENTOLIN HFA) 108 (90 BASE) MCG/ACT inhaler Inhale 2 puffs into the lungs every 6 (six) hours as needed for wheezing or shortness of breath.  1 Inhaler  5  . aspirin 81 MG tablet Take 81 mg by mouth daily.      Marland Kitchen atorvastatin (LIPITOR) 10 MG tablet Take 1 tablet (10 mg total) by mouth daily.  90 tablet  3  . CIALIS 20 MG tablet TAKE 1 TABLET BY MOUTH EVERY 36 HOURS AS NEEDED  5 tablet  3  . donepezil (ARICEPT) 10 MG tablet Take 23 mg by mouth at bedtime.       Marland Kitchen escitalopram (LEXAPRO) 10 MG tablet Take 1 tablet (10 mg total) by mouth daily.  90 tablet  3  .  Memantine HCl ER (NAMENDA XR) 28 MG CP24 Take 28 mg by mouth daily.  30 capsule  6  . Multiple Vitamins-Minerals (CENTRUM SILVER PO) Take by mouth daily.      . ondansetron (ZOFRAN-ODT) 4 MG disintegrating tablet Take 4 mg by mouth every 8 (eight) hours as needed for nausea.      . primidone (MYSOLINE) 50 MG tablet Take 3 tablets (150 mg total) by mouth at bedtime. Take at bedtime  270 tablet  3  . tamsulosin (FLOMAX) 0.4 MG CAPS capsule Take 0.4 mg by mouth daily.       No current facility-administered medications on file prior to visit.    No Known Allergies  Review of Systems  Review of Systems  Unable to perform ROS Constitutional: Negative for fever and malaise/fatigue.  HENT: Negative for congestion.   Eyes: Negative for discharge.  Respiratory: Negative for shortness of breath.   Cardiovascular: Negative for chest pain, palpitations and leg swelling.  Gastrointestinal: Negative for vomiting, abdominal pain and diarrhea.  Genitourinary: Negative for dysuria.  Musculoskeletal: Negative for falls.  Skin: Negative for rash.  Neurological: Negative for  loss of consciousness and headaches.  Endo/Heme/Allergies: Negative for environmental allergies and polydipsia.  Psychiatric/Behavioral: Negative for depression and suicidal ideas. The patient is not nervous/anxious and does not have insomnia.     Objective  BP 112/80  Pulse 76  Temp(Src) 97.9 F (36.6 C) (Oral)  Ht 5\' 10"  (1.778 m)  Wt 149 lb 1.3 oz (67.622 kg)  BMI 21.39 kg/m2  SpO2 93%  Physical Exam  Physical Exam  Constitutional: He is oriented to person, place, and time and well-developed, well-nourished, and in no distress. No distress.  HENT:  Head: Normocephalic and atraumatic.  Eyes: Conjunctivae are normal.  Neck: Neck supple. No thyromegaly present.  Cardiovascular: Normal rate, regular rhythm and normal heart sounds.   No murmur heard. Pulmonary/Chest: Effort normal and breath sounds normal. No respiratory distress.  Abdominal: He exhibits no distension and no mass. There is no tenderness.  Musculoskeletal: He exhibits no edema.  Neurological: He is alert and oriented to person, place, and time.  Skin: Skin is warm.  Psychiatric: Memory, affect and judgment normal.    Lab Results  Component Value Date   TSH 1.739 12/14/2013   Lab Results  Component Value Date   WBC 6.8 12/14/2013   HGB 15.8 12/14/2013   HCT 46.5 12/14/2013   MCV 92.8 12/14/2013   PLT 211 12/14/2013   Lab Results  Component Value Date   CREATININE 0.69 12/14/2013   BUN 13 12/14/2013   NA 140 12/14/2013   K 4.4 12/14/2013   CL 101 12/14/2013   CO2 30 12/14/2013   Lab Results  Component Value Date   ALT 37 12/14/2013   AST 38* 12/14/2013   ALKPHOS 104 12/14/2013   BILITOT 0.6 12/14/2013   Lab Results  Component Value Date   CHOL 167 12/14/2013   Lab Results  Component Value Date   HDL 75 12/14/2013   Lab Results  Component Value Date   LDLCALC 78 12/14/2013   Lab Results  Component Value Date   TRIG 70 12/14/2013   Lab Results  Component Value Date   CHOLHDL  2.2 12/14/2013     Assessment & Plan  Vasomotor rhinitis Will try Astelin nasal spray  Other and unspecified hyperlipidemia Well treated today, no changes  COPD (chronic obstructive pulmonary disease) Stable no recent flares.  ED (erectile dysfunction) Stable on current meds. ,  au cpmtomie sa,e  Weight loss Weight stabel since starting SSRI

## 2013-12-19 NOTE — Assessment & Plan Note (Signed)
Stable no recent flares 

## 2013-12-19 NOTE — Assessment & Plan Note (Signed)
Stable on current meds. ,au cpmtomie sa,e

## 2013-12-19 NOTE — Assessment & Plan Note (Signed)
Well treated today, no changes

## 2013-12-19 NOTE — Assessment & Plan Note (Signed)
Weight stabel since starting SSRI

## 2013-12-27 ENCOUNTER — Ambulatory Visit (INDEPENDENT_AMBULATORY_CARE_PROVIDER_SITE_OTHER): Payer: Medicare PPO | Admitting: Nurse Practitioner

## 2013-12-27 ENCOUNTER — Encounter: Payer: Self-pay | Admitting: Nurse Practitioner

## 2013-12-27 ENCOUNTER — Telehealth: Payer: Self-pay | Admitting: Family Medicine

## 2013-12-27 VITALS — BP 112/71 | HR 69 | Ht 70.0 in | Wt 138.0 lb

## 2013-12-27 DIAGNOSIS — R413 Other amnesia: Secondary | ICD-10-CM

## 2013-12-27 DIAGNOSIS — G3184 Mild cognitive impairment, so stated: Secondary | ICD-10-CM

## 2013-12-27 DIAGNOSIS — R110 Nausea: Secondary | ICD-10-CM

## 2013-12-27 DIAGNOSIS — G250 Essential tremor: Secondary | ICD-10-CM

## 2013-12-27 DIAGNOSIS — G252 Other specified forms of tremor: Secondary | ICD-10-CM

## 2013-12-27 DIAGNOSIS — F0390 Unspecified dementia without behavioral disturbance: Secondary | ICD-10-CM

## 2013-12-27 MED ORDER — DONEPEZIL HCL 10 MG PO TABS: 10.0000 mg | ORAL_TABLET | Freq: Every day | ORAL | Status: DC

## 2013-12-27 NOTE — Progress Notes (Signed)
PATIENT: Brandon Young. DOB: 1941-04-12   REASON FOR VISIT: follow up for memory loss, tremor HISTORY FROM: patient, spouse  HISTORY OF PRESENT ILLNESS: 73 y.o. right handed Caucasian male referred by [name], MD for evaluation and treatment of cognitive problems. He is accompanied by spouse. He is currently a patient of [name], Neurology in [place]. Patient is able to do his own ADLs. The family and the patient identify problems with changes in short term memory. Wife states she started noticing problems with short term memory 6 years ago. Family and patient report problems with disinterest in activities that he used to enjoy such as golf, which started about 3 years ago. Family and patient are concerned about progression of the memory loss and want a 2nd opinion of treatment options. He has had tremors in both hands which makes it difficult to write or eat.  He is being treated with Propranolol for this.  They see no benefit.  Medication administration: wife monitors medication usage Patient is driving with wife as passenger only. Patient denies visual and sensory hallucinations. Denies change in appetite. Patient has been taking Donepezil for approximately a year and a half, now taking 10mg  twice daily with no side effects.   UPDATE 08/25/13 (LL): Patient returns for follow up, wife had called for sooner appointment. States tremor and memory are getting worse. She states he is moody, has no interest in activities they used to do (eg. Going to [employer] with friends, taking trips with her, golf) and it is getting worse. He states that he just has no interest in doing those things anymore but he still golfs twice a week. Had recent problem with UTI and urinary retention, 2 trips to the ER, psa elevated, set for biopsy on Oct 1. Tremor makes it increasingly difficult to feed himself and drink without spilling. Drinks 2 glasses of red wine daily before dinner which helps calm tremors  down.   UPDATE 09/27/13 (LL): Patient and wife return for follow up. Wife reports that she thinks Lexapro may be helping her husbands mood, no side effects noted. Good response from adding Primidone at first, but now "back to like it was before." No new complaints. Would like to add Namenda, wife asks about adding Targretin for memory, knows someone on it.   UPDATE 12/27/13: Patient and wife return for follow up. He has been having nausea every morning for the past several months.  Donepizil was increased to 23 mg daily previously. Stopped Namenda due to cost. They think memory is stable, mood is better.  Tremors are worse, saw no improvement with increasing Primidone last visit.  Functional Assessment:  Activities of Daily Living (ADLs):  He is independent in the following: ambulation, bathing and hygiene, feeding, continence, grooming, toileting and dressing Requires assistance with the following: medication administration.  Outside reports reviewed: historical medical records, imaging reports: normal for age MRI, lab reports. The following portions of the patient's history were reviewed and updated as appropriate: allergies, current medications, past family history, past medical history, past social history, past surgical history and problem list.   Review of Systems  Respiratory: Wheezing, shortness of breath Neurological: positive for memory problems and confusion, tremors GI: nausea Allergic/Immunologic: runny nose   ALLERGIES: No Known Allergies  HOME MEDICATIONS: Outpatient Prescriptions Prior to Visit  Medication Sig Dispense Refill  . albuterol (PROVENTIL HFA;VENTOLIN HFA) 108 (90 BASE) MCG/ACT inhaler Inhale 2 puffs into the lungs every 6 (six) hours as needed  for wheezing or shortness of breath.  1 Inhaler  5  . aspirin 81 MG tablet Take 81 mg by mouth daily.      Marland Kitchen atorvastatin (LIPITOR) 10 MG tablet Take 1 tablet (10 mg total) by mouth daily.  90 tablet  3  . azelastine  (ASTELIN) 137 MCG/SPRAY nasal spray Place 2 sprays into both nostrils 2 (two) times daily. Use in each nostril as directed  30 mL  2  . CIALIS 20 MG tablet TAKE 1 TABLET BY MOUTH EVERY 36 HOURS AS NEEDED  5 tablet  3  . escitalopram (LEXAPRO) 10 MG tablet Take 1 tablet (10 mg total) by mouth daily.  90 tablet  3  . Multiple Vitamins-Minerals (CENTRUM SILVER PO) Take by mouth daily.      . ondansetron (ZOFRAN-ODT) 4 MG disintegrating tablet Take 4 mg by mouth every 8 (eight) hours as needed for nausea.      . primidone (MYSOLINE) 50 MG tablet Take 3 tablets (150 mg total) by mouth at bedtime. Take at bedtime  270 tablet  3  . tamsulosin (FLOMAX) 0.4 MG CAPS capsule Take 0.4 mg by mouth daily.      Marland Kitchen donepezil (ARICEPT) 10 MG tablet Take 23 mg by mouth at bedtime.       . Memantine HCl ER (NAMENDA XR) 28 MG CP24 Take 28 mg by mouth daily.  30 capsule  6   No facility-administered medications prior to visit.    PAST MEDICAL HISTORY: Past Medical History  Diagnosis Date  . Asthma     childhood- age 14  . History of chicken pox     childhood  . Ulcer of abdomen wall 1988  . History of blood transfusion 1988  . Acute bronchitis 04/14/2013  . Dementia   . COPD (chronic obstructive pulmonary disease) 05/02/2013  . Elevated PSA 08/22/2013  . Other and unspecified hyperlipidemia 08/22/2013  . 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 Abdominal surgery  1988    bleed ulcer  . Tonsillectomy    . Hernia repair  2013    FAMILY HISTORY: Family History  Problem Relation Age of Onset  . Colon cancer Neg Hx   . Heart disease Neg Hx   . Prostate cancer Neg Hx   . Breast cancer Neg Hx   . Diabetes Neg Hx   . Hypertension Mother   . Dementia Mother   . Other Father     GI infection/disturbance  . Tremor Father   . COPD Sister     smoker  . Tremor Sister   . Tremor Brother   . Tremor Sister     SOCIAL HISTORY: History     Social History  . Marital Status: Married    Spouse Name: martha    Number of Children: 2  . Years of Education: MED   Occupational History  . retired    Social History Main Topics  . Smoking status: Former Games developer  . Smokeless tobacco: Never Used     Comment: 1 ppd for 30 years  . Alcohol Use: Yes     Comment: wine  . Drug Use: No  . Sexual Activity: Yes   Other Topics Concern  . Not on file   Social History Narrative  . No narrative on file     PHYSICAL EXAM  Filed Vitals:   12/27/13 1102  BP: 112/71  Pulse: 69  Height: 5\' 10"  (  1.778 m)  Weight: 138 lb (62.596 kg)   Body mass index is 19.8 kg/(m^2).  Generalized: Well developed, in no acute distress  Head: normocephalic and atraumatic. Oropharynx benign  Neck: Supple, no carotid bruits  Cardiac: Regular rate rhythm, no murmur  Musculoskeletal: No deformity   Neurological examination   MENTAL STATUS: awake, alert, language fluent, comprehension intact MMSE 19/25, unable to copy drawing or draw clock due to tremor. RECALL 2/3. AFT 9. (Last visit MMSE 25/30 with deficits in orientation to time, season, building, RECALL 1/3, Clock drawing 3/4. AFT 10. ) BORDERLINE MYERSON. NEG SNOUT. NEG PALMOMENTAL.  CRANIAL NERVE:  pupils equal and reactive to light, visual fields full to confrontation, extraocular muscles intact, no nystagmus, facial sensation and strength symmetric, uvula midline, shoulder shrug symmetric, tongue midline, STAINED BLACK FROM WINE.  MOTOR: normal bulk and tone, full strength in the BUE, BLE, fine finger movements slow. mixed tremor: resting tremor of head and left hand, coarse action tremor in bilateral hands.  SENSORY: normal and symmetric to light touch, pinprick.  COORDINATION: finger-nose-finger with tremor bilaterally REFLEXES: deep tendon reflexes present and symmetric  GAIT/STATION: narrow based gait; able to walk on toes, heels and tandem; romberg is negative. Decreased arm swing when  walking.   DIAGNOSTIC DATA (LABS, IMAGING, TESTING) - I reviewed patient records, labs, notes, testing and imaging myself where available.  Lab Results  Component Value Date   WBC 6.8 12/14/2013   HGB 15.8 12/14/2013   HCT 46.5 12/14/2013   MCV 92.8 12/14/2013   PLT 211 12/14/2013      Component Value Date/Time   NA 140 12/14/2013 1045   K 4.4 12/14/2013 1045   CL 101 12/14/2013 1045   CO2 30 12/14/2013 1045   GLUCOSE 105* 12/14/2013 1045   BUN 13 12/14/2013 1045   CREATININE 0.69 12/14/2013 1045   CREATININE 1.00 05/22/2013 1010   CALCIUM 9.2 12/14/2013 1045   PROT 6.4 12/14/2013 1045   ALBUMIN 4.1 12/14/2013 1045   ALBUMIN 4.1 12/14/2013 1045   AST 38* 12/14/2013 1045   ALT 37 12/14/2013 1045   ALKPHOS 104 12/14/2013 1045   BILITOT 0.6 12/14/2013 1045   GFRNONAA 73* 05/22/2013 1010   GFRAA 85* 05/22/2013 1010   Lab Results  Component Value Date   CHOL 167 12/14/2013   HDL 75 12/14/2013   LDLCALC 78 12/14/2013   TRIG 70 12/14/2013   CHOLHDL 2.2 12/14/2013   Lab Results  Component Value Date   HGBA1C 5.6 12/14/2013   Lab Results  Component Value Date   VITAMINB12 738 03/31/2012   Lab Results  Component Value Date   TSH 1.739 12/14/2013   ASSESSMENT AND PLAN 73 y.o. year old male has a past medical history of Asthma; History of chicken pox; Ulcer of abdomen wall (1988); History of blood transfusion (1988); Acute bronchitis (04/14/2013); Dementia; and COPD (chronic obstructive pulmonary disease) (05/02/2013). here for evaluation of memory loss. 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 Discussed progressive course of the disease and treatment options. Namenda was discontinued due to cost.  Since increasing Donepizil to 23mg , he has had nausea every morning.  Tremor has worsened.  1. Reduce dose of Donepizil, to 10 mg daily. Rx sent to [facility]. 2. Continue Primidone to 150  mg 3. Continue Lexapro 10 mg daily.  4. Follow up in 6 weeks, after you have seen ENT. Once nausea is better, we can  try adding a new medication (Sinemet) to treat tremor and possibly wean off Primidone.  Meds ordered this encounter  Medications  . donepezil (ARICEPT) 10 MG tablet    Sig: Take 1 tablet (10 mg total) by mouth at bedtime.    Dispense:  90 tablet    Refill:  3    Order Specific Question:  Supervising Provider    Answer:  Suanne MarkerPENUMALLI, VIKRAM R [[id number]]   Return in about 6 weeks (around 02/07/2014). with Dr. Jerl SantosPenumalli  LYNN E. LAM, MSN, NP-C 12/27/2013, 12:54 PM [facility] [address], [address] [place], [place] [address] [phone]  Note: This document was prepared with digital dictation and possible smart phrase technology. Any transcriptional errors that result from this process are unintentional.

## 2013-12-27 NOTE — Telephone Encounter (Signed)
Patient was in 2 or 3 weeks ago for an [place].  He is still having problems when he lays down at night.  His wife wants a referral to an ENT

## 2013-12-27 NOTE — Patient Instructions (Addendum)
1. Reduce dose of Donepizil, to 10 mg daily. Rx sent to [facility]. 2. Continue Primidone to 150 mg 3. Continue Lexapro 10 mg daily.  4. Follow up in 6 weeks, after you have seen ENT. Once nausea is better, we can try adding a new medication to treat tremor.

## 2013-12-28 ENCOUNTER — Other Ambulatory Visit: Payer: Self-pay | Admitting: Family Medicine

## 2013-12-28 DIAGNOSIS — J310 Chronic rhinitis: Secondary | ICD-10-CM

## 2013-12-28 NOTE — Telephone Encounter (Signed)
done

## 2013-12-28 NOTE — Telephone Encounter (Signed)
Please advise 

## 2014-01-07 NOTE — Progress Notes (Signed)
I reviewed note and agree with plan.   Suanne MarkerVIKRAM R. Nickie Warwick, MD 01/07/2014, 1:52 PM Certified in Neurology, Neurophysiology and Neuroimaging  [facility] [address], [address] [place], [place] [address] [phone]

## 2014-01-13 ENCOUNTER — Telehealth: Payer: Self-pay | Admitting: Family Medicine

## 2014-01-13 NOTE — Telephone Encounter (Signed)
Relevant patient education assigned to patient using Emmi. ° °

## 2014-03-17 ENCOUNTER — Encounter: Payer: Self-pay | Admitting: Diagnostic Neuroimaging

## 2014-03-17 ENCOUNTER — Ambulatory Visit (INDEPENDENT_AMBULATORY_CARE_PROVIDER_SITE_OTHER): Payer: Medicare PPO | Admitting: Diagnostic Neuroimaging

## 2014-03-17 VITALS — BP 159/102 | HR 133 | Ht 70.0 in | Wt 148.5 lb

## 2014-03-17 DIAGNOSIS — G252 Other specified forms of tremor: Secondary | ICD-10-CM

## 2014-03-17 DIAGNOSIS — F03A0 Unspecified dementia, mild, without behavioral disturbance, psychotic disturbance, mood disturbance, and anxiety: Secondary | ICD-10-CM

## 2014-03-17 DIAGNOSIS — F0390 Unspecified dementia without behavioral disturbance: Secondary | ICD-10-CM

## 2014-03-17 DIAGNOSIS — G250 Essential tremor: Secondary | ICD-10-CM

## 2014-03-17 DIAGNOSIS — G20A1 Parkinson's disease without dyskinesia, without mention of fluctuations: Secondary | ICD-10-CM

## 2014-03-17 DIAGNOSIS — G20 Parkinson's disease: Secondary | ICD-10-CM

## 2014-03-17 MED ORDER — CLONAZEPAM 0.5 MG PO TABS: 0.5000 mg | ORAL_TABLET | Freq: Every day | ORAL | Status: DC | PRN

## 2014-03-17 MED ORDER — CARBIDOPA-LEVODOPA 25-100 MG PO TABS: 1.0000 | ORAL_TABLET | Freq: Three times a day (TID) | ORAL | Status: DC

## 2014-03-17 NOTE — Progress Notes (Signed)
[facility]  PATIENT: Brandon Young. DOB: [birth year]  REFERRING CLINICIAN:  HISTORY FROM: patient and wife REASON FOR VISIT: follow up   HISTORICAL  CHIEF COMPLAINT:  Chief Complaint  Patient presents with  . Follow-up    [facility]    HISTORY OF PRESENT ILLNESS:   UPDATE 03/17/14 (VRP): Since last visit patient's memory is stable. Since last visit patient's memory is stable. Tremor has progressed to worsen. He is having significant difficulty eating and drinking. Patient's nausea has resolved after reducing donepezil from 20 mg down to 10 mg daily. Patient. Primidone. He has been on propranolol 40 mg twice a day without relief of tremor. Patient no longer driving outside of the community. Sometimes he does drive from [place] home [place] community. Patient walks about 1 mile per day.  UPDATE 12/27/13 (LL): Patient and wife return for follow up. He has been having nausea every morning for the past several months.  Donepizil was increased to 23 mg daily previously. Stopped Namenda due to cost. They think memory is stable, mood is better.  Tremors are worse, saw no improvement with increasing Primidone last visit.  UPDATE 08/25/13 (LL): Patient returns for follow up, wife had called for sooner appointment. States tremor and memory are getting worse. She states he is moody, has no interest in activities they used to do (eg. Going to [employer] with friends, taking trips with her, golf) and it is getting worse. He states that he just has no interest in doing those things anymore but he still golfs twice a week. Had recent problem with UTI and urinary retention, 2 trips to the ER, psa elevated, set for biopsy on Oct 1. Tremor makes it increasingly difficult to feed himself and drink without spilling. Drinks 2 glasses of red wine daily before dinner which helps calm tremors down.   UPDATE 09/27/13 (LL): Patient and wife return for follow up. Wife reports that  she thinks Lexapro may be helping her husbands mood, no side effects noted. Good response from adding Primidone at first, but now "back to like it was before." No new complaints. Would like to add Namenda, wife asks about adding Targretin for memory, knows someone on it.   PRIOR HPI (05/18/13, VRP): 73 year old right handed Caucasian male referred by [name], MD for evaluation and treatment of cognitive problems. He is accompanied by spouse. He is currently a patient of [name], Neurology in [place]. Patient is able to do his own ADLs. The family and the patient identify problems with changes in short term memory. Wife states she started noticing problems with short term memory 6 years ago. Family and patient report problems with disinterest in activities that he used to enjoy such as golf, which started about 3 years ago. Family and patient are concerned about progression of the memory loss and want a 2nd opinion of treatment options. He has had tremors in both hands which makes it difficult to write or eat.  He is being treated with Propranolol for this.  They see no benefit.  Medication administration: wife monitors medication usage Patient is driving with wife as passenger only. Patient denies visual and sensory hallucinations. Denies change in appetite. Patient has been taking Donepezil for approximately a year and a half, now taking 10mg  twice daily with no side effects.   REVIEW OF SYSTEMS: Full 14 system review of systems performed and notable only for wheezing cold intolerance memory loss tremor or nervousness  anxiety.  ALLERGIES: No Known Allergies  HOME MEDICATIONS: Outpatient Prescriptions Prior to Visit  Medication Sig Dispense Refill  . albuterol (PROVENTIL HFA;VENTOLIN HFA) 108 (90 BASE) MCG/ACT inhaler Inhale 2 puffs into the lungs every 6 (six) hours as needed for wheezing or shortness of breath.  1 Inhaler  5  . aspirin 81 MG tablet Take 81 mg by mouth daily.      Marland Kitchen. atorvastatin  (LIPITOR) 10 MG tablet Take 1 tablet (10 mg total) by mouth daily.  90 tablet  3  . CIALIS 20 MG tablet TAKE 1 TABLET BY MOUTH EVERY 36 HOURS AS NEEDED  5 tablet  3  . donepezil (ARICEPT) 10 MG tablet Take 1 tablet (10 mg total) by mouth at bedtime.  90 tablet  3  . escitalopram (LEXAPRO) 10 MG tablet Take 1 tablet (10 mg total) by mouth daily.  90 tablet  3  . Multiple Vitamins-Minerals (CENTRUM SILVER PO) Take by mouth daily.      . ondansetron (ZOFRAN-ODT) 4 MG disintegrating tablet Take 4 mg by mouth every 8 (eight) hours as needed for nausea.      . tamsulosin (FLOMAX) 0.4 MG CAPS capsule Take 0.4 mg by mouth daily.      Marland Kitchen. azelastine (ASTELIN) 137 MCG/SPRAY nasal spray Place 2 sprays into both nostrils 2 (two) times daily. Use in each nostril as directed  30 mL  2  . primidone (MYSOLINE) 50 MG tablet Take 3 tablets (150 mg total) by mouth at bedtime. Take at bedtime  270 tablet  3   No facility-administered medications prior to visit.    PAST MEDICAL HISTORY: Past Medical History  Diagnosis Date  . Asthma     childhood- age 89  . History of chicken pox     childhood  . Ulcer of abdomen wall 1988  . History of blood transfusion 1988  . Acute bronchitis 04/14/2013  . Dementia   . COPD (chronic obstructive pulmonary disease) 05/02/2013  . Elevated PSA 08/22/2013  . Other and unspecified hyperlipidemia 08/22/2013  . 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 Abdominal surgery  1988    bleed ulcer  . Tonsillectomy    . Hernia repair  2013    FAMILY HISTORY: Family History  Problem Relation Age of Onset  . Colon cancer Neg Hx   . Heart disease Neg Hx   . Prostate cancer Neg Hx   . Breast cancer Neg Hx   . Diabetes Neg Hx   . Hypertension Mother   . Dementia Mother   . Other Father     GI infection/disturbance  . Tremor Father   . COPD Sister     smoker  . Tremor Sister   . Tremor Brother   . Tremor  Sister     SOCIAL HISTORY:  History   Social History  . Marital Status: Married    Spouse Name: martha    Number of Children: 2  . Years of Education: MED   Occupational History  . retired    Social History Main Topics  . Smoking status: Former Games developermoker  . Smokeless tobacco: Never Used     Comment: 1 ppd for 30 years  . Alcohol Use: Yes     Comment: wine  . Drug Use: No  . Sexual Activity: Yes   Other Topics Concern  . Not on file   Social History Narrative   Patient lives at  home with spouse.   Caffeine Use: 2 cups daily     PHYSICAL EXAM  Filed Vitals:   03/17/14 1359  BP: 159/102  Pulse: 133  Height: 5\' 10"  (1.778 m)  Weight: 148 lb 8 oz (67.359 kg)    Not recorded    Body mass index is 21.31 kg/(m^2).  GENERAL EXAM: Patient is in no distress; well developed, nourished and groomed; neck is supple; CONTINUOUS HEAD AND BODY TREMORS.  CARDIOVASCULAR: Regular rate and rhythm, no murmurs, no carotid bruits  NEUROLOGIC: MENTAL STATUS: awake, alert, oriented to "March 05, 2014, TUES, [place]; [place], [place]". 1/5 ON SERIAL 7'S. REGISTERS 3/3. RECALLS 2/3. MISSES 1 FOR INSTRUCTIONS AND 1 FOR PENTAGONS. AFT 9. MMSE 21/30. Language fluent, comprehension intact, naming intact, fund of knowledge appropriate CRANIAL NERVE: no papilledema on fundoscopic exam, pupils equal and reactive to light, visual fields full to confrontation, extraocular muscles intact, no nystagmus, facial sensation and strength symmetric, hearing intact, palate elevates symmetrically, uvula midline, shoulder shrug symmetric, tongue midline. POSITIVE SNOUT AND MYERSONS REFLEXES. MOTOR: RESTING > POSTURAL AND ACTION TREMOR OF BUE AND BLE. COGWHEELING IN LUE ? RUE WITH REINFORCEMENT. BRADYKINESIA IN LEFT > RIGHT SIDE (UE AND LE). Normal bulk, full strength in the BUE, BLE SENSORY: normal and symmetric to light touch COORDINATION: finger-nose-finger, fine finger movements SLOW AND WITH  DYSMETRIA. REFLEXES: deep tendon reflexes present and symmetric GAIT/STATION: ABSENT ARM SWING. SMALL STEPS. EN BLOC TURNING.     DIAGNOSTIC DATA (LABS, IMAGING, TESTING) - I reviewed patient records, labs, notes, testing and imaging myself where available.  Lab Results  Component Value Date   WBC 6.8 12/14/2013   HGB 15.8 12/14/2013   HCT 46.5 12/14/2013   MCV 92.8 12/14/2013   PLT 211 12/14/2013      Component Value Date/Time   NA 140 12/14/2013 1045   K 4.4 12/14/2013 1045   CL 101 12/14/2013 1045   CO2 30 12/14/2013 1045   GLUCOSE 105* 12/14/2013 1045   BUN 13 12/14/2013 1045   CREATININE 0.69 12/14/2013 1045   CREATININE 1.00 05/22/2013 1010   CALCIUM 9.2 12/14/2013 1045   PROT 6.4 12/14/2013 1045   ALBUMIN 4.1 12/14/2013 1045   ALBUMIN 4.1 12/14/2013 1045   AST 38* 12/14/2013 1045   ALT 37 12/14/2013 1045   ALKPHOS 104 12/14/2013 1045   BILITOT 0.6 12/14/2013 1045   GFRNONAA 73* 05/22/2013 1010   GFRAA 85* 05/22/2013 1010   Lab Results  Component Value Date   CHOL 167 12/14/2013   HDL 75 12/14/2013   LDLCALC 78 12/14/2013   TRIG 70 12/14/2013   CHOLHDL 2.2 12/14/2013   Lab Results  Component Value Date   HGBA1C 5.6 12/14/2013   Lab Results  Component Value Date   VITAMINB12 738 03/31/2012   Lab Results  Component Value Date   TSH 1.739 12/14/2013    I reviewed images myself and agree with interpretation. -VRP  07/28/12 MRI brain - Normal for age noncontrast MRI appearance of the brain.   ASSESSMENT AND PLAN  73 y.o. male with memory loss, mixed tremor, cogwheel rigidity, masked facies, bradykinesia. Now clinical picture most consistent with dementia with lewy bodies.   PLAN: 1. Continue donepezil 10 mg daily 2. Trial of carb/levo for tremor 3. Stop propranolol 4. Clonazepam prn car rides/anxiety  Meds ordered this encounter  Medications  . ipratropium (ATROVENT) 0.03 % nasal spray    Sig:   . DISCONTD: propranolol (INDERAL) 40 MG tablet     Sig:  Take 40 mg by mouth 2 (two) times daily.  . carbidopa-levodopa (SINEMET IR) 25-100 MG per tablet    Sig: Take 1 tablet by mouth 3 (three) times daily.    Dispense:  90 tablet    Refill:  12  . clonazePAM (KLONOPIN) 0.5 MG tablet    Sig: Take 1 tablet (0.5 mg total) by mouth daily as needed for anxiety.    Dispense:  10 tablet    Refill:  3   Return in about 3 months (around 06/17/2014).   Suanne Marker, MD 03/17/2014, 2:46 PM Certified in Neurology, Neurophysiology and Neuroimaging  [facility] [address] [place], [place] [address] [phone]

## 2014-03-17 NOTE — Patient Instructions (Signed)
Stop propranolol.  Start carbidopa-levodopa (half tab three times per day; 30 min before meals). After 2 weeks, increase to 1 full tab three times per day.  Continue donepezil.  Try clonazepam 0.5 mg tab as needed for anxiety / long car rides.

## 2014-04-11 ENCOUNTER — Encounter: Payer: Self-pay | Admitting: Family Medicine

## 2014-04-11 ENCOUNTER — Ambulatory Visit (INDEPENDENT_AMBULATORY_CARE_PROVIDER_SITE_OTHER): Payer: Medicare PPO | Admitting: Family Medicine

## 2014-04-11 VITALS — BP 104/70 | HR 68 | Temp 97.7°F | Ht 70.0 in | Wt 147.1 lb

## 2014-04-11 DIAGNOSIS — J309 Allergic rhinitis, unspecified: Secondary | ICD-10-CM

## 2014-04-11 DIAGNOSIS — T7840XA Allergy, unspecified, initial encounter: Secondary | ICD-10-CM

## 2014-04-11 DIAGNOSIS — F0390 Unspecified dementia without behavioral disturbance: Secondary | ICD-10-CM

## 2014-04-11 DIAGNOSIS — R739 Hyperglycemia, unspecified: Secondary | ICD-10-CM

## 2014-04-11 DIAGNOSIS — R259 Unspecified abnormal involuntary movements: Secondary | ICD-10-CM

## 2014-04-11 DIAGNOSIS — R7309 Other abnormal glucose: Secondary | ICD-10-CM

## 2014-04-11 DIAGNOSIS — R251 Tremor, unspecified: Secondary | ICD-10-CM

## 2014-04-11 DIAGNOSIS — R634 Abnormal weight loss: Secondary | ICD-10-CM

## 2014-04-11 DIAGNOSIS — J300 Vasomotor rhinitis: Secondary | ICD-10-CM

## 2014-04-11 DIAGNOSIS — Z23 Encounter for immunization: Secondary | ICD-10-CM

## 2014-04-11 DIAGNOSIS — E785 Hyperlipidemia, unspecified: Secondary | ICD-10-CM

## 2014-04-11 LAB — CBC
HCT: 45.1 % (ref 39.0–52.0)
Hemoglobin: 15.5 g/dL (ref 13.0–17.0)
MCH: 32.2 pg (ref 26.0–34.0)
MCHC: 34.4 g/dL (ref 30.0–36.0)
MCV: 93.6 fL (ref 78.0–100.0)
PLATELETS: 171 10*3/uL (ref 150–400)
RBC: 4.82 MIL/uL (ref 4.22–5.81)
RDW: 13.9 % (ref 11.5–15.5)
WBC: 5.8 10*3/uL (ref 4.0–10.5)

## 2014-04-11 MED ORDER — FLUTICASONE PROPIONATE 50 MCG/ACT NA SUSP: 2.0000 | Freq: Every day | NASAL | Status: DC | PRN

## 2014-04-11 NOTE — Progress Notes (Signed)
Pre visit review using our clinic review tool, if applicable. No additional management support is needed unless otherwise documented below in the visit note. 

## 2014-04-11 NOTE — Patient Instructions (Signed)

## 2014-04-12 LAB — HEPATIC FUNCTION PANEL
ALBUMIN: 4 g/dL (ref 3.5–5.2)
ALT: 11 U/L (ref 0–53)
AST: 30 U/L (ref 0–37)
Alkaline Phosphatase: 95 U/L (ref 39–117)
Bilirubin, Direct: 0.2 mg/dL (ref 0.0–0.3)
Indirect Bilirubin: 0.7 mg/dL (ref 0.2–1.2)
Total Bilirubin: 0.9 mg/dL (ref 0.2–1.2)
Total Protein: 6.3 g/dL (ref 6.0–8.3)

## 2014-04-12 LAB — LIPID PANEL
CHOL/HDL RATIO: 2 ratio
Cholesterol: 168 mg/dL (ref 0–200)
HDL: 83 mg/dL (ref >39)
LDL CALC: 73 mg/dL (ref 0–99)
TRIGLYCERIDES: 59 mg/dL (ref <150)
VLDL: 12 mg/dL (ref 0–40)

## 2014-04-12 LAB — RENAL FUNCTION PANEL
ALBUMIN: 4 g/dL (ref 3.5–5.2)
BUN: 19 mg/dL (ref 6–23)
CALCIUM: 9.1 mg/dL (ref 8.4–10.5)
CO2: 28 meq/L (ref 19–32)
CREATININE: 0.79 mg/dL (ref 0.50–1.35)
Chloride: 101 mEq/L (ref 96–112)
GLUCOSE: 98 mg/dL (ref 70–99)
Phosphorus: 3 mg/dL (ref 2.3–4.6)
Potassium: 4.6 mEq/L (ref 3.5–5.3)
Sodium: 137 mEq/L (ref 135–145)

## 2014-04-12 LAB — TSH: TSH: 1.939 u[IU]/mL (ref 0.350–4.500)

## 2014-04-17 ENCOUNTER — Encounter: Payer: Self-pay | Admitting: Family Medicine

## 2014-04-17 NOTE — Assessment & Plan Note (Signed)
A1C is normal. Minimize simple cars, increase exercise.

## 2014-04-17 NOTE — Assessment & Plan Note (Signed)
He has tolerated Aricept well and they feel he has evenshownsome imprvoement. Will refill and continue

## 2014-04-17 NOTE — Assessment & Plan Note (Signed)
Tolerating statin, encouraged heart healthy diet, avoid trans fats, minimize simple carbs and saturated fats. Increase exercise as tolerated 

## 2014-04-17 NOTE — Assessment & Plan Note (Signed)
Partially responsive to Astelin and nasal saline, continue the same

## 2014-04-17 NOTE — Assessment & Plan Note (Signed)
Eating well. Weight is stable. No changes

## 2014-04-17 NOTE — Progress Notes (Signed)
Patient ID: Brandon Fabro., male   DOB: 1941-11-01, 73 y.o.   MRN: [id number] Brandon Young [id number] [birth year] 04/17/2014      Progress Note-Follow Up  Subjective  Chief Complaint  Chief Complaint  Patient presents with  . Follow-up    4 month  . Injections    prevnar    HPI  Patient is a 73 year old male in today for routine medical care. In today with his wife they report he is doing better. He has had a stabilization and perhaps even improvement in his memory. They do note Sinemet was added and they're afraid his tremor has gotten worse to the point where he has trouble feeding himself. Otherwise he had no complaints. The nasal Atrovent and nasal saline has helped his vasomotor rhinitis. No recent illness. His following still with [facility] every 6 months and doing well. Denies CP/palp/SOB/HA/congestion/fevers/GI or GU c/o. Taking meds as prescribed  Past Medical History  Diagnosis Date  . Asthma     childhood- age 32  . History of chicken pox     childhood  . Ulcer of abdomen wall 1988  . History of blood transfusion 1988  . Acute bronchitis 04/14/2013  . Dementia   . COPD (chronic obstructive pulmonary disease) 05/02/2013  . Elevated PSA 08/22/2013  . Other and unspecified hyperlipidemia 08/22/2013  . 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 Abdominal surgery  1988    bleed ulcer  . Tonsillectomy    . Hernia repair  2013    Family History  Problem Relation Age of Onset  . Colon cancer Neg Hx   . Heart disease Neg Hx   . Prostate cancer Neg Hx   . Breast cancer Neg Hx   . Diabetes Neg Hx   . Hypertension Mother   . Dementia Mother   . Other Father     GI infection/disturbance  . Tremor Father   . COPD Sister     smoker  . Tremor Sister   . Tremor Brother   . Tremor Sister     History   Social History  . Marital Status: Married    Spouse Name: martha    Number of Children: 2  . Years  of Education: MED   Occupational History  . retired    Social History Main Topics  . Smoking status: Former Games developer  . Smokeless tobacco: Never Used     Comment: 1 ppd for 30 years  . Alcohol Use: Yes     Comment: wine  . Drug Use: No  . Sexual Activity: Yes   Other Topics Concern  . Not on file   Social History Narrative   Patient lives at home with spouse.   Caffeine Use: 2 cups daily    Current Outpatient Prescriptions on File Prior to Visit  Medication Sig Dispense Refill  . albuterol (PROVENTIL HFA;VENTOLIN HFA) 108 (90 BASE) MCG/ACT inhaler Inhale 2 puffs into the lungs every 6 (six) hours as needed for wheezing or shortness of breath.  1 Inhaler  5  . aspirin 81 MG tablet Take 81 mg by mouth daily.      Marland Kitchen atorvastatin (LIPITOR) 10 MG tablet Take 1 tablet (10 mg total) by mouth daily.  90 tablet  3  . carbidopa-levodopa (SINEMET IR) 25-100 MG per tablet Take 1 tablet by mouth 3 (three) times daily.  90 tablet  12  . CIALIS 20 MG  tablet TAKE 1 TABLET BY MOUTH EVERY 36 HOURS AS NEEDED  5 tablet  3  . clonazePAM (KLONOPIN) 0.5 MG tablet Take 1 tablet (0.5 mg total) by mouth daily as needed for anxiety.  10 tablet  3  . donepezil (ARICEPT) 10 MG tablet Take 1 tablet (10 mg total) by mouth at bedtime.  90 tablet  3  . escitalopram (LEXAPRO) 10 MG tablet Take 1 tablet (10 mg total) by mouth daily.  90 tablet  3  . ipratropium (ATROVENT) 0.03 % nasal spray       . Multiple Vitamins-Minerals (CENTRUM SILVER PO) Take by mouth daily.      . ondansetron (ZOFRAN-ODT) 4 MG disintegrating tablet Take 4 mg by mouth every 8 (eight) hours as needed for nausea.      . tamsulosin (FLOMAX) 0.4 MG CAPS capsule Take 0.4 mg by mouth daily.       No current facility-administered medications on file prior to visit.    No Known Allergies  Review of Systems  Review of Systems  Constitutional: Negative for fever and malaise/fatigue.  HENT: Negative for congestion.   Eyes: Negative for  discharge.  Respiratory: Negative for shortness of breath.   Cardiovascular: Negative for chest pain, palpitations and leg swelling.  Gastrointestinal: Negative for nausea, abdominal pain and diarrhea.  Genitourinary: Negative for dysuria.  Musculoskeletal: Negative for falls.  Skin: Negative for rash.  Neurological: Positive for tremors. Negative for loss of consciousness and headaches.  Endo/Heme/Allergies: Negative for polydipsia.  Psychiatric/Behavioral: Positive for memory loss. Negative for depression and suicidal ideas. The patient is not nervous/anxious and does not have insomnia.     Objective  BP 104/70  Pulse 68  Temp(Src) 97.7 F (36.5 C) (Oral)  Ht 5\' 10"  (1.778 m)  Wt 147 lb 1.3 oz (66.715 kg)  BMI 21.10 kg/m2  SpO2 90%  Physical Exam  Physical Exam  Constitutional: He is oriented to person, place, and time and well-developed, well-nourished, and in no distress. No distress.  HENT:  Head: Normocephalic and atraumatic.  Eyes: Conjunctivae are normal.  Neck: Neck supple. No thyromegaly present.  Cardiovascular: Normal rate, regular rhythm and normal heart sounds.   No murmur heard. Pulmonary/Chest: Effort normal and breath sounds normal. No respiratory distress.  Abdominal: He exhibits no distension and no mass. There is no tenderness.  Musculoskeletal: He exhibits no edema.  Neurological: He is alert and oriented to person, place, and time.  Skin: Skin is warm.  Psychiatric: Memory, affect and judgment normal.    Lab Results  Component Value Date   TSH 1.939 04/11/2014   Lab Results  Component Value Date   WBC 5.8 04/11/2014   HGB 15.5 04/11/2014   HCT 45.1 04/11/2014   MCV 93.6 04/11/2014   PLT 171 04/11/2014   Lab Results  Component Value Date   CREATININE 0.79 04/11/2014   BUN 19 04/11/2014   NA 137 04/11/2014   K 4.6 04/11/2014   CL 101 04/11/2014   CO2 28 04/11/2014   Lab Results  Component Value Date   ALT 11 04/11/2014   AST 30 04/11/2014    ALKPHOS 95 04/11/2014   BILITOT 0.9 04/11/2014   Lab Results  Component Value Date   CHOL 168 04/11/2014   Lab Results  Component Value Date   HDL 83 04/11/2014   Lab Results  Component Value Date   LDLCALC 73 04/11/2014   Lab Results  Component Value Date   TRIG 59 04/11/2014   Lab  Results  Component Value Date   CHOLHDL 2.0 04/11/2014     Assessment & Plan  Dementia He has tolerated Aricept well and they feel he has evenshownsome imprvoement. 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 Minimize simple cars, increase exercise.  Weight loss Eating well. Weight is stable. No changes  Other and unspecified hyperlipidemia Tolerating statin, encouraged heart healthy diet, avoid trans fats, minimize simple carbs and saturated fats. Increase exercise as tolerated  Tremor Now established with neurology. 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 Tolerating Sinemet but they question if his tremor has gotten worse since he started with. It is beginning to affect his ADLs such as eating. Will discuss with neurology

## 2014-04-17 NOTE — Assessment & Plan Note (Signed)
Now established with neurology. 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 Tolerating Sinemet but they question if his tremor has gotten worse since he started with. It is beginning to affect his ADLs such as eating. Will discuss with neurology

## 2014-04-18 ENCOUNTER — Telehealth: Payer: Self-pay | Admitting: Diagnostic Neuroimaging

## 2014-04-18 NOTE — Telephone Encounter (Signed)
Patient's wife calling to state that patient was placed on Carbidopa Levodopa for Parkinson's, but was taken off of his medication for tremors. Patient's wife states that his tremors are as bad or are getting worse, to the point that he cannot eat anything but a sandwich because his hands shake so hard. Please call and advise patient.

## 2014-04-19 MED ORDER — CARBIDOPA-LEVODOPA 25-100 MG PO TABS: 2.0000 | ORAL_TABLET | Freq: Three times a day (TID) | ORAL | Status: DC

## 2014-04-19 NOTE — Telephone Encounter (Signed)
I called pts wife. After switching to carb/levo 1 tabTID, x 1-2 weeks, was doing much better with tremor control for 1 week, but now tremor has returned. Will increase carb/levo to 1.5 tabs TID x 1-2 weeks, then up to 2 tabs TID if needed.  Suanne MarkerVIKRAM R. Marnell Mcdaniel, MD 04/19/2014, 8:59 AM Certified in Neurology, Neurophysiology and Neuroimaging  [facility] [address], [address] [place], [place] [id number] [phone]

## 2014-04-19 NOTE — Telephone Encounter (Signed)
Pt's wife Brandon Young calling stating the the pt was placed on Carbidopa levodopa for Parkinson's Disease and was taken off of his medication for tremors and wife states that pt tremors are worse then before. Please advise

## 2014-06-02 ENCOUNTER — Telehealth: Payer: Self-pay | Admitting: Diagnostic Neuroimaging

## 2014-06-02 ENCOUNTER — Encounter: Payer: Self-pay | Admitting: Diagnostic Neuroimaging

## 2014-06-02 NOTE — Telephone Encounter (Signed)
Left message for patient regarding rescheduling 06/17/14 appointment per Dr. Richrd Humbles schedule, printed and mailed letter with new appointment time.

## 2014-06-03 ENCOUNTER — Telehealth: Payer: Self-pay | Admitting: *Deleted

## 2014-06-06 NOTE — Telephone Encounter (Signed)
LMVM for wife to return call.  

## 2014-06-06 NOTE — Telephone Encounter (Signed)
I called and spoke to spouse.  She is concerned about husband.  Wants appt to assess as previously recommended by Dr. Marjory LiesPenumalli.   Made app 06-22-14 at 0930.

## 2014-06-06 NOTE — Telephone Encounter (Signed)
Spouse returning Sandy's call. °

## 2014-06-17 ENCOUNTER — Ambulatory Visit: Payer: Medicare PPO | Admitting: Diagnostic Neuroimaging

## 2014-06-22 ENCOUNTER — Ambulatory Visit (INDEPENDENT_AMBULATORY_CARE_PROVIDER_SITE_OTHER): Payer: Medicare PPO | Admitting: Diagnostic Neuroimaging

## 2014-06-22 ENCOUNTER — Encounter: Payer: Self-pay | Admitting: Diagnostic Neuroimaging

## 2014-06-22 VITALS — BP 106/68 | HR 60 | Temp 97.1°F | Ht 70.0 in | Wt 151.0 lb

## 2014-06-22 DIAGNOSIS — F03A0 Unspecified dementia, mild, without behavioral disturbance, psychotic disturbance, mood disturbance, and anxiety: Secondary | ICD-10-CM

## 2014-06-22 DIAGNOSIS — F0390 Unspecified dementia without behavioral disturbance: Secondary | ICD-10-CM

## 2014-06-22 DIAGNOSIS — G20 Parkinson's disease: Secondary | ICD-10-CM

## 2014-06-22 DIAGNOSIS — G252 Other specified forms of tremor: Principal | ICD-10-CM

## 2014-06-22 DIAGNOSIS — G250 Essential tremor: Secondary | ICD-10-CM

## 2014-06-22 MED ORDER — ONDANSETRON 4 MG PO TBDP
4.0000 mg | ORAL_TABLET | Freq: Three times a day (TID) | ORAL | Status: AC | PRN
Start: 2014-06-22 — End: ?

## 2014-06-22 MED ORDER — PROPRANOLOL HCL 40 MG PO TABS: 40.0000 mg | ORAL_TABLET | Freq: Two times a day (BID) | ORAL | Status: DC

## 2014-06-22 MED ORDER — CARBIDOPA-LEVODOPA 25-100 MG PO TABS: 1.5000 | ORAL_TABLET | Freq: Three times a day (TID) | ORAL | Status: DC

## 2014-06-22 NOTE — Patient Instructions (Signed)
Increase carb/levo up to 1.5 tabs three times per day, slowly over 3 weeks.  Restart propranolol 40mg  twice a day.

## 2014-06-22 NOTE — Progress Notes (Signed)
[facility]  PATIENT: Brandon Young B Pressley Jr. DOB: 09/07/1941  REFERRING CLINICIAN:  HISTORY FROM: patient and wife REASON FOR VISIT: follow up   HISTORICAL  CHIEF COMPLAINT:  Chief Complaint  Patient presents with  . Follow-up    Dementia    HISTORY OF PRESENT ILLNESS:   UPDATE 06/22/14 (VRP): Since last visit, memory is stable. Rest tremor is slightly better on carb/levo (1 tab TID). Tried 1.5 tabs TID x 1 week, but had problems with nausea. Postural/action tremor is stable/slightly worse, esp with eating, since coming off propranolol. Now only able to eat foods with hands, not utensils. In the evening, he has glass of wine at 4pm, and by dinner time he is able to eat a little bit better with better tremor control.  UPDATE 03/17/14 (VRP): Since last visit patient's memory is stable. Tremor has progressed to worsen. He is having significant difficulty eating and drinking. Patient's nausea has resolved after reducing donepezil from 20 mg down to 10 mg daily. He has been on propranolol 40 mg twice a day without relief of tremor. Patient no longer driving outside of the community. Sometimes he does drive from their home to the clubhouse within the community. Patient walks about 1 mile per day.  UPDATE 12/27/13 (LL): Patient and wife return for follow up. He has been having nausea every morning for the past several months.  Donepizil was increased to 23 mg daily previously. Stopped Namenda due to cost. They think memory is stable, mood is better.  Tremors are worse, saw no improvement with increasing Primidone last visit.  UPDATE 08/25/13 (LL): Patient returns for follow up, wife had called for sooner appointment. States tremor and memory are getting worse. She states he is moody, has no interest in activities they used to do (eg. Going to [employer] games with friends, taking trips with her, golf) and it is getting worse. He states that he just has no interest in doing those things  anymore but he still golfs twice a week. Had recent problem with UTI and urinary retention, 2 trips to the ER, psa elevated, set for biopsy on Oct 1. Tremor makes it increasingly difficult to feed himself and drink without spilling. Drinks 2 glasses of red wine daily before dinner which helps calm tremors down.   UPDATE 09/27/13 (LL): Patient and wife return for follow up. Wife reports that she thinks Lexapro may be helping her husbands mood, no side effects noted. Good response from adding Primidone at first, but now "back to like it was before." No new complaints. Would like to add Namenda, wife asks about adding Targretin for memory, knows someone on it.   PRIOR HPI (05/18/13, VRP): 73 year old right handed Caucasian male referred by Danise EdgeStacey Blyth, MD for evaluation and treatment of cognitive problems. He is accompanied by spouse. He is currently a patient of Royston SinnerJoseph Miller, Neurology in [place]. Patient is able to do his own ADLs. The family and the patient identify problems with changes in short term memory. Wife states she started noticing problems with short term memory 6 years ago. Family and patient report problems with disinterest in activities that he used to enjoy such as golf, which started about 3 years ago. Family and patient are concerned about progression of the memory loss and want a 2nd opinion of treatment options. He has had tremors in both hands which makes it difficult to write or eat.  He is being treated with Propranolol for this.  They see  no benefit.  Medication administration: wife monitors medication usage Patient is driving with wife as passenger only. Patient denies visual and sensory hallucinations. Denies change in appetite. Patient has been taking Donepezil for approximately a year and a half, now taking 10mg  twice daily with no side effects.   REVIEW OF SYSTEMS: Full 14 system review of systems performed and notable only for memory loss easy bruise/bleeding  anxiety.   ALLERGIES: No Known Allergies  HOME MEDICATIONS: Outpatient Prescriptions Prior to Visit  Medication Sig Dispense Refill  . albuterol (PROVENTIL HFA;VENTOLIN HFA) 108 (90 BASE) MCG/ACT inhaler Inhale 2 puffs into the lungs every 6 (six) hours as needed for wheezing or shortness of breath.  1 Inhaler  5  . aspirin 81 MG tablet Take 81 mg by mouth daily.      Marland Kitchen atorvastatin (LIPITOR) 10 MG tablet Take 1 tablet (10 mg total) by mouth daily.  90 tablet  3  . CIALIS 20 MG tablet TAKE 1 TABLET BY MOUTH EVERY 36 HOURS AS NEEDED  5 tablet  3  . clonazePAM (KLONOPIN) 0.5 MG tablet Take 1 tablet (0.5 mg total) by mouth daily as needed for anxiety.  10 tablet  3  . escitalopram (LEXAPRO) 10 MG tablet Take 1 tablet (10 mg total) by mouth daily.  90 tablet  3  . fluticasone (FLONASE) 50 MCG/ACT nasal spray Place 2 sprays into both nostrils daily as needed for allergies or rhinitis.  16 g  6  . ipratropium (ATROVENT) 0.03 % nasal spray       . Multiple Vitamins-Minerals (CENTRUM SILVER PO) Take by mouth daily.      . ondansetron (ZOFRAN-ODT) 4 MG disintegrating tablet Take 4 mg by mouth every 8 (eight) hours as needed for nausea.      . tamsulosin (FLOMAX) 0.4 MG CAPS capsule Take 0.4 mg by mouth daily.      . carbidopa-levodopa (SINEMET IR) 25-100 MG per tablet Take 2 tablets by mouth 3 (three) times daily.  180 tablet  12  . donepezil (ARICEPT) 10 MG tablet Take 1 tablet (10 mg total) by mouth at bedtime.  90 tablet  3   No facility-administered medications prior to visit.    PAST MEDICAL HISTORY: Past Medical History  Diagnosis Date  . Asthma     childhood- age 13  . History of chicken pox     childhood  . Ulcer of abdomen wall 1988  . History of blood transfusion 1988  . Acute bronchitis 04/14/2013  . Dementia   . COPD (chronic obstructive pulmonary disease) 05/02/2013  . Elevated PSA 08/22/2013  . Other and unspecified hyperlipidemia 08/22/2013  . 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 Abdominal surgery  1988    bleed ulcer  . Tonsillectomy    . Hernia repair  2013    FAMILY HISTORY: Family History  Problem Relation Age of Onset  . Colon cancer Neg Hx   . Heart disease Neg Hx   . Prostate cancer Neg Hx   . Breast cancer Neg Hx   . Diabetes Neg Hx   . Hypertension Mother   . Dementia Mother   . Other Father     GI infection/disturbance  . Tremor Father   . COPD Sister     smoker  . Tremor Sister   . Tremor Brother   . Tremor Sister     SOCIAL HISTORY:  History   Social  History  . Marital Status: Married    Spouse Name: martha    Number of Children: 2  . Years of Education: MED   Occupational History  . retired    Social History Main Topics  . Smoking status: Former Games developermoker  . Smokeless tobacco: Never Used     Comment: 1 ppd for 30 years  . Alcohol Use: Yes     Comment: wine  . Drug Use: No  . Sexual Activity: Yes   Other Topics Concern  . Not on file   Social History Narrative   Patient lives at home with spouse.   Caffeine Use: 2 cups daily     PHYSICAL EXAM  Filed Vitals:   06/22/14 0913  BP: 106/68  Pulse: 60  Temp: 97.1 F (36.2 C)  TempSrc: Oral  Height: 5\' 10"  (1.778 m)  Weight: 151 lb (68.493 kg)    Not recorded    Body mass index is 21.67 kg/(m^2).  GENERAL EXAM: Patient is in no distress; well developed, nourished and groomed; neck is supple; CONTINUOUS HEAD, ARMS, LEGS, BODY TREMORS.  CARDIOVASCULAR: Regular rate and rhythm, no murmurs, no carotid bruits  NEUROLOGIC: MENTAL STATUS: awake, alert, oriented. MMSE 23/30. AFT 16. AFT 9. MMSE 21/30. Language fluent, comprehension intact, naming intact, fund of knowledge appropriate CRANIAL NERVE: no papilledema on fundoscopic exam, pupils equal and reactive to light, visual fields full to confrontation, extraocular muscles intact, no nystagmus, facial sensation and strength  symmetric, hearing intact, palate elevates symmetrically, uvula midline, shoulder shrug symmetric, tongue midline. POSITIVE SNOUT AND MYERSONS REFLEXES. MOTOR: RESTING > POSTURAL AND ACTION TREMOR OF BUE AND BLE. COGWHEELING IN LUE > RUE WITH REINFORCEMENT. BRADYKINESIA IN LEFT > RIGHT SIDE (UE AND LE). Normal bulk, full strength in the BUE, BLE SENSORY: normal and symmetric to light touch COORDINATION: finger-nose-finger, fine finger movements SLOW AND WITH DYSMETRIA. REFLEXES: deep tendon reflexes present and symmetric GAIT/STATION: ABSENT ARM SWING ON RIGHT; TREMOR IN LEFT ARM WITH WALKING. SHORT STEPS. SLOW TURNING.    DIAGNOSTIC DATA (LABS, IMAGING, TESTING) - I reviewed patient records, labs, notes, testing and imaging myself where available.  Lab Results  Component Value Date   WBC 5.8 04/11/2014   HGB 15.5 04/11/2014   HCT 45.1 04/11/2014   MCV 93.6 04/11/2014   PLT 171 04/11/2014      Component Value Date/Time   NA 137 04/11/2014 1114   K 4.6 04/11/2014 1114   CL 101 04/11/2014 1114   CO2 28 04/11/2014 1114   GLUCOSE 98 04/11/2014 1114   BUN 19 04/11/2014 1114   CREATININE 0.79 04/11/2014 1114   CREATININE 1.00 05/22/2013 1010   CALCIUM 9.1 04/11/2014 1114   PROT 6.3 04/11/2014 1114   ALBUMIN 4.0 04/11/2014 1114   ALBUMIN 4.0 04/11/2014 1114   AST 30 04/11/2014 1114   ALT 11 04/11/2014 1114   ALKPHOS 95 04/11/2014 1114   BILITOT 0.9 04/11/2014 1114   GFRNONAA 73* 05/22/2013 1010   GFRAA 85* 05/22/2013 1010   Lab Results  Component Value Date   CHOL 168 04/11/2014   HDL 83 04/11/2014   LDLCALC 73 04/11/2014   TRIG 59 04/11/2014   CHOLHDL 2.0 04/11/2014   Lab Results  Component Value Date   HGBA1C 5.6 12/14/2013   Lab Results  Component Value Date   VITAMINB12 738 03/31/2012   Lab Results  Component Value Date   TSH 1.939 04/11/2014    I reviewed images myself and agree with interpretation. -VRP  07/28/12 MRI  brain - Normal for age noncontrast MRI appearance of the  brain.   ASSESSMENT AND PLAN  74 y.o. male with memory loss, mixed tremor, cogwheel rigidity, masked facies, bradykinesia. Slight benefit with wine. Not much benefit with primidone, propranolol or carb/levo so far.   Dx: essential tremor + parkinsonism/memory loss (PD/dementia vs dementia with lewy bodies)  PLAN: 1. Continue donepezil 10 mg daily 2. Try increasing carb/levo more slowly this time up to 1.5 tabs TID 3. Restart propranolol 40mg  BID 4. Second opinion at [facility] for diagnostic, therapeutic eval, medical mgmt, and also consideration of DBS options, although his mild dementia would likely exclude him from surgical options  Return in about 4 months (around 10/23/2014).   Suanne Marker, MD 06/22/2014, 9:59 AM Certified in Neurology, Neurophysiology and Neuroimaging  [facility] [address], [address] [place], [place] [address] [phone]

## 2014-06-23 ENCOUNTER — Telehealth: Payer: Self-pay

## 2014-06-23 NOTE — Telephone Encounter (Signed)
Called patient. Spoke to spouse. Advised app[facility] scheduled at [facility] w/ Dr. Judith BlonderHawk @ 8:00 am on 09/22/14. Gave spouse phone number, advised patient on wait list and to look for packet from [facility] with additional info. Spouse agreed.

## 2014-07-14 DIAGNOSIS — Z0289 Encounter for other administrative examinations: Secondary | ICD-10-CM

## 2014-08-10 ENCOUNTER — Other Ambulatory Visit: Payer: Self-pay | Admitting: Family Medicine

## 2014-08-10 MED ORDER — ATORVASTATIN CALCIUM 10 MG PO TABS: 10.0000 mg | ORAL_TABLET | Freq: Every day | ORAL | Status: DC

## 2014-08-10 NOTE — Telephone Encounter (Signed)
rx printed because escript down. rx faxed

## 2014-08-10 NOTE — Addendum Note (Signed)
Addended by: Court JoyFREEMAN, Samira Acero L on: 08/10/2014 07:53 AM   Modules accepted: Orders

## 2014-08-11 ENCOUNTER — Other Ambulatory Visit: Payer: Self-pay | Admitting: Family Medicine

## 2014-08-15 ENCOUNTER — Ambulatory Visit: Payer: Medicare PPO | Admitting: Diagnostic Neuroimaging

## 2014-09-12 ENCOUNTER — Telehealth: Payer: Self-pay | Admitting: Family Medicine

## 2014-09-12 NOTE — Telephone Encounter (Signed)
Yes we need to work him in, I will take suggestions, please find a spot, work in to lunch? Come in early if no other meeting? I am game.

## 2014-09-12 NOTE — Telephone Encounter (Signed)
Left message for patient to return my call.

## 2014-09-12 NOTE — Telephone Encounter (Signed)
Pt had appt for 10/04/14 for his phy, however his wife is having eye surgery that day, pt does not drive, wife wants to know if pt can be worked in before next year, or if dr. Abner Greenspan can suggest he see someone else.

## 2014-09-13 NOTE — Telephone Encounter (Signed)
Patient scheduled appointment for 09/16/14

## 2014-09-16 ENCOUNTER — Encounter: Payer: Self-pay | Admitting: Family Medicine

## 2014-09-16 ENCOUNTER — Ambulatory Visit (INDEPENDENT_AMBULATORY_CARE_PROVIDER_SITE_OTHER): Payer: Medicare PPO | Admitting: Family Medicine

## 2014-09-16 VITALS — BP 126/82 | HR 49 | Temp 97.8°F | Ht 70.0 in | Wt 148.4 lb

## 2014-09-16 DIAGNOSIS — Z0000 Encounter for general adult medical examination without abnormal findings: Secondary | ICD-10-CM

## 2014-09-16 DIAGNOSIS — E875 Hyperkalemia: Secondary | ICD-10-CM

## 2014-09-16 DIAGNOSIS — E785 Hyperlipidemia, unspecified: Secondary | ICD-10-CM

## 2014-09-16 DIAGNOSIS — J4489 Other specified chronic obstructive pulmonary disease: Secondary | ICD-10-CM

## 2014-09-16 DIAGNOSIS — I10 Essential (primary) hypertension: Secondary | ICD-10-CM

## 2014-09-16 DIAGNOSIS — R739 Hyperglycemia, unspecified: Secondary | ICD-10-CM

## 2014-09-16 DIAGNOSIS — R7309 Other abnormal glucose: Secondary | ICD-10-CM

## 2014-09-16 DIAGNOSIS — J449 Chronic obstructive pulmonary disease, unspecified: Secondary | ICD-10-CM

## 2014-09-16 LAB — RENAL FUNCTION PANEL
Albumin: 3.9 g/dL (ref 3.5–5.2)
BUN: 20 mg/dL (ref 6–23)
CALCIUM: 8.9 mg/dL (ref 8.4–10.5)
CHLORIDE: 106 meq/L (ref 96–112)
CO2: 24 mEq/L (ref 19–32)
Creatinine, Ser: 1 mg/dL (ref 0.4–1.5)
GFR: 82.38 mL/min (ref >60.00)
Glucose, Bld: 111 mg/dL — ABNORMAL HIGH (ref 70–99)
POTASSIUM: 5.3 meq/L — AB (ref 3.5–5.1)
Phosphorus: 3.7 mg/dL (ref 2.3–4.6)
SODIUM: 140 meq/L (ref 135–145)

## 2014-09-16 LAB — CBC
HCT: 48.5 % (ref 39.0–52.0)
Hemoglobin: 15.9 g/dL (ref 13.0–17.0)
MCHC: 32.8 g/dL (ref 30.0–36.0)
MCV: 97.7 fl (ref 78.0–100.0)
PLATELETS: 148 10*3/uL — AB (ref 150.0–400.0)
RBC: 4.97 Mil/uL (ref 4.22–5.81)
RDW: 14.2 % (ref 11.5–15.5)
WBC: 8.4 10*3/uL (ref 4.0–10.5)

## 2014-09-16 LAB — LIPID PANEL
CHOL/HDL RATIO: 2
Cholesterol: 151 mg/dL (ref 0–200)
HDL: 66.3 mg/dL (ref >39.00)
LDL Cholesterol: 76 mg/dL (ref 0–99)
NONHDL: 84.7
TRIGLYCERIDES: 46 mg/dL (ref 0.0–149.0)
VLDL: 9.2 mg/dL (ref 0.0–40.0)

## 2014-09-16 LAB — HEPATIC FUNCTION PANEL
ALK PHOS: 86 U/L (ref 39–117)
ALT: 31 U/L (ref 0–53)
AST: 34 U/L (ref 0–37)
Albumin: 3.9 g/dL (ref 3.5–5.2)
BILIRUBIN DIRECT: 0.3 mg/dL (ref 0.0–0.3)
Total Bilirubin: 0.8 mg/dL (ref 0.2–1.2)
Total Protein: 6.7 g/dL (ref 6.0–8.3)

## 2014-09-16 LAB — TSH: TSH: 1.8 u[IU]/mL (ref 0.35–4.50)

## 2014-09-16 NOTE — Patient Instructions (Signed)

## 2014-09-16 NOTE — Progress Notes (Signed)
Pre visit review using our clinic review tool, if applicable. No additional management support is needed unless otherwise documented below in the visit note. Brandon Young [id number] 12-19-1941 09/18/2014      Progress Note-Follow Up  Subjective  Chief Complaint  No chief complaint on file.   HPI  Patient is a 73 year old male in today for routine medical care. Patient is in today for annual exam. Is generally doing well but is frustrated with his ongoing tremor and so is in the process of needing a new neurologist at [facility] next month. Declines colonoscopy. Getting his flu shot next week. No recent illness. Denies CP/palp/SOB/HA/congestion/fevers/GI or GU c/o. Taking meds as prescribed  Past Medical History  Diagnosis Date  . Asthma     childhood- age 81  . History of chicken pox     childhood  . Ulcer of abdomen wall 1988  . History of blood transfusion 1988  . Acute bronchitis 04/14/2013  . Dementia   . COPD (chronic obstructive pulmonary disease) 05/02/2013  . Elevated PSA 08/22/2013  . Other and unspecified hyperlipidemia 08/22/2013  . 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 Abdominal surgery  1988    bleed ulcer  . Tonsillectomy    . Hernia repair  2013    Family History  Problem Relation Age of Onset  . Colon cancer Neg Hx   . Heart disease Neg Hx   . Prostate cancer Neg Hx   . Breast cancer Neg Hx   . Diabetes Neg Hx   . Hypertension Mother   . Dementia Mother   . Other Father     GI infection/disturbance  . Tremor Father   . COPD Sister     smoker  . Tremor Sister   . Tremor Brother   . Tremor Sister     History   Social History  . Marital Status: Married    Spouse Name: martha    Number of Children: 2  . Years of Education: MED   Occupational History  . retired    Social History Main Topics  . Smoking status: Former Games developer  . Smokeless tobacco: Never Used     Comment: 1 ppd  for 30 years  . Alcohol Use: Yes     Comment: wine  . Drug Use: No  . Sexual Activity: Yes     Comment: lives with wife, no major dietary restrictions    Other Topics Concern  . Not on file   Social History Narrative   Patient lives at home with spouse.   Caffeine Use: 2 cups daily    Current Outpatient Prescriptions on File Prior to Visit  Medication Sig Dispense Refill  . albuterol (PROVENTIL HFA;VENTOLIN HFA) 108 (90 BASE) MCG/ACT inhaler Inhale 2 puffs into the lungs every 6 (six) hours as needed for wheezing or shortness of breath.  1 Inhaler  5  . aspirin 81 MG tablet Take 81 mg by mouth daily.      Marland Kitchen atorvastatin (LIPITOR) 10 MG tablet TAKE 1 TABLET (10 MG TOTAL) BY MOUTH DAILY.  90 tablet  3  . carbidopa-levodopa (SINEMET IR) 25-100 MG per tablet Take 1.5 tablets by mouth 3 (three) times daily.  180 tablet  12  . CIALIS 20 MG tablet TAKE 1 TABLET BY MOUTH EVERY 36 HOURS AS NEEDED  5 tablet  3  . clonazePAM (KLONOPIN) 0.5 MG tablet Take 1 tablet (0.5 mg  total) by mouth daily as needed for anxiety.  10 tablet  3  . donepezil (ARICEPT) 10 MG tablet Take 10 mg by mouth every morning.      . escitalopram (LEXAPRO) 10 MG tablet Take 1 tablet (10 mg total) by mouth daily.  90 tablet  3  . fluticasone (FLONASE) 50 MCG/ACT nasal spray Place 2 sprays into both nostrils daily as needed for allergies or rhinitis.  16 g  6  . ipratropium (ATROVENT) 0.03 % nasal spray       . Multiple Vitamins-Minerals (CENTRUM SILVER PO) Take by mouth daily.      . ondansetron (ZOFRAN-ODT) 4 MG disintegrating tablet Take 1 tablet (4 mg total) by mouth every 8 (eight) hours as needed for nausea.  30 tablet  3  . propranolol (INDERAL) 40 MG tablet Take 1 tablet (40 mg total) by mouth 2 (two) times daily.  60 tablet  12   No current facility-administered medications on file prior to visit.    No Known Allergies  Review of Systems  Review of Systems  Constitutional: Negative for fever and  malaise/fatigue.  HENT: Negative for congestion.   Eyes: Negative for discharge.  Respiratory: Negative for shortness of breath.   Cardiovascular: Negative for chest pain, palpitations and leg swelling.  Gastrointestinal: Negative for nausea, abdominal pain and diarrhea.  Genitourinary: Negative for dysuria.  Musculoskeletal: Negative for falls.  Skin: Negative for rash.  Neurological: Positive for tremors. Negative for loss of consciousness and headaches.  Endo/Heme/Allergies: Negative for polydipsia.  Psychiatric/Behavioral: Negative for depression and suicidal ideas. The patient is not nervous/anxious and does not have insomnia.     Objective  BP 126/82  Pulse 49  Temp(Src) 97.8 F (36.6 C) (Oral)  Ht  (1.778 m)  Wt 148 lb 6.4 oz (67.314 kg)  BMI 21.29 kg/m2  SpO2 93%  Physical Exam  Physical Exam  Constitutional: He is oriented to person, place, and time and well-developed, well-nourished, and in no distress. No distress.  HENT:  Head: Normocephalic and atraumatic.  Eyes: Conjunctivae are normal.  Neck: Neck supple. No thyromegaly present.  Cardiovascular: Normal rate, regular rhythm and normal heart sounds.   No murmur heard. Pulmonary/Chest: Effort normal and breath sounds normal. No respiratory distress.  Abdominal: He exhibits no distension and no mass. There is no tenderness.  Musculoskeletal: He exhibits no edema.  Neurological: He is alert and oriented to person, place, and time.  Skin: Skin is warm.  Psychiatric: Memory, affect and judgment normal.    Lab Results  Component Value Date   TSH 1.80 09/16/2014   Lab Results  Component Value Date   WBC 8.4 09/16/2014   HGB 15.9 09/16/2014   HCT 48.5 09/16/2014   MCV 97.7 09/16/2014   PLT 148.0* 09/16/2014   Lab Results  Component Value Date   CREATININE 1.0 09/16/2014   BUN 20 09/16/2014   NA 140 09/16/2014   K 5.3* 09/16/2014   CL 106 09/16/2014   CO2 24 09/16/2014   Lab Results  Component Value Date    ALT 31 09/16/2014   AST 34 09/16/2014   ALKPHOS 86 09/16/2014   BILITOT 0.8 09/16/2014   Lab Results  Component Value Date   CHOL 151 09/16/2014   Lab Results  Component Value Date   HDL 66.30 09/16/2014   Lab Results  Component Value Date   LDLCALC 76 09/16/2014   Lab Results  Component Value Date   TRIG 46.0 09/16/2014   Lab  Results  Component Value Date   CHOLHDL 2 09/16/2014     Assessment & Plan  Other and unspecified hyperlipidemia Encouraged heart healthy diet, increase exercise, avoid trans fats, consider a krill oil cap daily. Continue Statin  COPD (chronic obstructive pulmonary disease) Stable on current meds  Encounter for Medicare annual wellness exam Patient denies any difficulties at home. No trouble with ADLs, depression or falls. No recent changes to vision or hearing. Is UTD with immunizations. Is UTD with screening. Discussed Advanced Directives, patient agrees to bring [place] copies of documents if can. Encouraged heart healthy diet, exercise as tolerated and adequate sleep. Patient lives in an assisted living facility at times all of his ADLs well. They do not have any advanced directives and are not interested in a DO NOT RESUSCITATE at this time.  Dr. [name] of neurology at [facility] and no other M.D. did this time. Denies any depression.patient declines colonoscopy but agrees to complete IFOB

## 2014-09-18 NOTE — Assessment & Plan Note (Signed)
Encouraged heart healthy diet, increase exercise, avoid trans fats, consider a krill oil cap daily. Continue Statin

## 2014-09-18 NOTE — Assessment & Plan Note (Signed)
Stable on current meds 

## 2014-09-18 NOTE — Assessment & Plan Note (Addendum)
Patient denies any difficulties at home. No trouble with ADLs, depression or falls. No recent changes to vision or hearing. Is UTD with immunizations. Is UTD with screening. Discussed Advanced Directives, patient agrees to bring [place] copies of documents if can. Encouraged heart healthy diet, exercise as tolerated and adequate sleep. Patient lives in an assisted living facility at times all of his ADLs well. They do not have any advanced directives and are not interested in a DO NOT RESUSCITATE at this time.  Dr. [name] of neurology at [facility] and no other M.D. did this time. Denies any depression.patient declines colonoscopy but agrees to complete IFOB

## 2014-09-19 NOTE — Addendum Note (Signed)
Addended by: Court Joy on: 09/19/2014 02:33 PM   Modules accepted: Orders

## 2014-10-04 ENCOUNTER — Ambulatory Visit: Payer: Medicare PPO | Admitting: Family Medicine

## 2014-10-19 ENCOUNTER — Other Ambulatory Visit: Payer: Self-pay | Admitting: Neurology

## 2014-10-25 ENCOUNTER — Ambulatory Visit (INDEPENDENT_AMBULATORY_CARE_PROVIDER_SITE_OTHER): Payer: Medicare PPO | Admitting: Diagnostic Neuroimaging

## 2014-10-25 ENCOUNTER — Encounter: Payer: Self-pay | Admitting: Diagnostic Neuroimaging

## 2014-10-25 VITALS — BP 88/54 | HR 63 | Ht 70.0 in | Wt 150.6 lb

## 2014-10-25 DIAGNOSIS — G252 Other specified forms of tremor: Principal | ICD-10-CM

## 2014-10-25 DIAGNOSIS — G250 Essential tremor: Secondary | ICD-10-CM

## 2014-10-25 DIAGNOSIS — R251 Tremor, unspecified: Secondary | ICD-10-CM

## 2014-10-25 DIAGNOSIS — F0390 Unspecified dementia without behavioral disturbance: Secondary | ICD-10-CM

## 2014-10-25 DIAGNOSIS — G20 Parkinson's disease: Secondary | ICD-10-CM

## 2014-10-25 DIAGNOSIS — F03A0 Unspecified dementia, mild, without behavioral disturbance, psychotic disturbance, mood disturbance, and anxiety: Secondary | ICD-10-CM

## 2014-10-25 NOTE — Progress Notes (Signed)
[facility]  PATIENT: Brandon Young. DOB: 09/22/1941  REFERRING CLINICIAN:  HISTORY FROM: patient and wife REASON FOR VISIT: follow up   HISTORICAL  CHIEF COMPLAINT:  Chief Complaint  Patient presents with  . Follow-up    HISTORY OF PRESENT ILLNESS:   UPDATE 10/25/14: Since last visit, went to [facility] movement disorder clinic, and dx of ET/PD confirmed. Now on higher dose of carb/levo and propranolol was switched back to primidone. Memory stable.  UPDATE 06/22/14 (VRP): Since last visit, memory is stable. Rest tremor is slightly better on carb/levo (1 tab TID). Tried 1.5 tabs TID x 1 week, but had problems with nausea. Postural/action tremor is stable/slightly worse, esp with eating, since coming off propranolol. Now only able to eat foods with hands, not utensils. In the evening, he has glass of wine at 4pm, and by dinner time he is able to eat a little bit better with better tremor control.  UPDATE 03/17/14 (VRP): Since last visit patient's memory is stable. Tremor has progressed to worsen. He is having significant difficulty eating and drinking. Patient's nausea has resolved after reducing donepezil from 20 mg down to 10 mg daily. He has been on propranolol 40 mg twice a day without relief of tremor. Patient no longer driving outside of the community. Sometimes he does drive from their home to the clubhouse within the community. Patient walks about 1 mile per day.  UPDATE 12/27/13 (LL): Patient and wife return for follow up. He has been having nausea every morning for the past several months.  Donepizil was increased to 23 mg daily previously. Stopped Namenda due to cost. They think memory is stable, mood is better.  Tremors are worse, saw no improvement with increasing Primidone last visit.  UPDATE 08/25/13 (LL): Patient returns for follow up, wife had called for sooner appointment. States tremor and memory are getting worse. She states he is moody, has no interest  in activities they used to do (eg. Going to [employer] with friends, taking trips with her, golf) and it is getting worse. He states that he just has no interest in doing those things anymore but he still golfs twice a week. Had recent problem with UTI and urinary retention, 2 trips to the ER, psa elevated, set for biopsy on Oct 1. Tremor makes it increasingly difficult to feed himself and drink without spilling. Drinks 2 glasses of red wine daily before dinner which helps calm tremors down.   UPDATE 09/27/13 (LL): Patient and wife return for follow up. Wife reports that she thinks Lexapro may be helping her husbands mood, no side effects noted. Good response from adding Primidone at first, but now "back to like it was before." No new complaints. Would like to add Namenda, wife asks about adding Targretin for memory, knows someone on it.   PRIOR HPI (05/18/13, VRP): 73 year old right handed Caucasian male referred by [name], MD for evaluation and treatment of cognitive problems. He is accompanied by spouse. He is currently a patient of [name], Neurology in [place]. Patient is able to do his own ADLs. The family and the patient identify problems with changes in short term memory. Wife states she started noticing problems with short term memory 6 years ago. Family and patient report problems with disinterest in activities that he used to enjoy such as golf, which started about 3 years ago. Family and patient are concerned about progression of the memory loss and want a 2nd opinion of treatment  options. He has had tremors in both hands which makes it difficult to write or eat.  He is being treated with Propranolol for this.  They see no benefit.  Medication administration: wife monitors medication usage Patient is driving with wife as passenger only. Patient denies visual and sensory hallucinations. Denies change in appetite. Patient has been taking Donepezil for approximately a year and a half, now taking  10mg  twice daily with no side effects.   REVIEW OF SYSTEMS: Full 14 system review of systems performed and notable only for memory loss tremors confusion wheezing.   ALLERGIES: No Known Allergies  HOME MEDICATIONS: Outpatient Prescriptions Prior to Visit  Medication Sig Dispense Refill  . albuterol (PROVENTIL HFA;VENTOLIN HFA) 108 (90 BASE) MCG/ACT inhaler Inhale 2 puffs into the lungs every 6 (six) hours as needed for wheezing or shortness of breath. 1 Inhaler 5  . aspirin 81 MG tablet Take 81 mg by mouth daily.    Marland Kitchen atorvastatin (LIPITOR) 10 MG tablet TAKE 1 TABLET (10 MG TOTAL) BY MOUTH DAILY. 90 tablet 3  . carbidopa-levodopa (SINEMET IR) 25-100 MG per tablet Take 1.5 tablets by mouth 3 (three) times daily. (Patient taking differently: Take 2 tablets by mouth 3 (three) times daily. ) 180 tablet 12  . CIALIS 20 MG tablet TAKE 1 TABLET BY MOUTH EVERY 36 HOURS AS NEEDED 5 tablet 3  . clonazePAM (KLONOPIN) 0.5 MG tablet Take 1 tablet (0.5 mg total) by mouth daily as needed for anxiety. 10 tablet 3  . escitalopram (LEXAPRO) 10 MG tablet TAKE 1 TABLET EVERY DAY 90 tablet 2  . ipratropium (ATROVENT) 0.03 % nasal spray     . Multiple Vitamins-Minerals (CENTRUM SILVER PO) Take by mouth daily.    . ondansetron (ZOFRAN-ODT) 4 MG disintegrating tablet Take 1 tablet (4 mg total) by mouth every 8 (eight) hours as needed for nausea. 30 tablet 3  . donepezil (ARICEPT) 10 MG tablet Take 10 mg by mouth every morning.    . fluticasone (FLONASE) 50 MCG/ACT nasal spray Place 2 sprays into both nostrils daily as needed for allergies or rhinitis. 16 g 6  . propranolol (INDERAL) 40 MG tablet Take 1 tablet (40 mg total) by mouth 2 (two) times daily. 60 tablet 12   No facility-administered medications prior to visit.    PAST MEDICAL HISTORY: Past Medical History  Diagnosis Date  . Asthma     childhood- age 73  . History of chicken pox     childhood  . Ulcer of abdomen wall 1988  . History of blood  transfusion 1988  . Acute bronchitis 04/14/2013  . Dementia   . COPD (chronic obstructive pulmonary disease) 05/02/2013  . Elevated PSA 08/22/2013  . Other and unspecified hyperlipidemia 08/22/2013  . 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 Abdominal surgery  1988    bleed ulcer  . Tonsillectomy    . Hernia repair  2013    FAMILY HISTORY: Family History  Problem Relation Age of Onset  . Colon cancer Neg Hx   . Heart disease Neg Hx   . Prostate cancer Neg Hx   . Breast cancer Neg Hx   . Diabetes Neg Hx   . Hypertension Mother   . Dementia Mother   . Other Father     GI infection/disturbance  . Tremor Father   . COPD Sister     smoker  . Tremor Sister   . Tremor  Brother   . Tremor Sister     SOCIAL HISTORY:  History   Social History  . Marital Status: Married    Spouse Name: martha    Number of Children: 2  . Years of Education: MED   Occupational History  . retired    Social History Main Topics  . Smoking status: Former Games developermoker  . Smokeless tobacco: Never Used     Comment: 1 ppd for 30 years  . Alcohol Use: Yes     Comment: wine  . Drug Use: No  . Sexual Activity: Yes     Comment: lives with wife, no major dietary restrictions    Other Topics Concern  . Not on file   Social History Narrative   Patient lives at home with spouse.   Caffeine Use: 2 cups daily     PHYSICAL EXAM  Filed Vitals:   10/25/14 1408  BP: 88/54  Pulse: 63  Height: 5\' 10"  (1.778 m)  Weight: 150 lb 9.6 oz (68.312 kg)    Not recorded      Body mass index is 21.61 kg/(m^2).  GENERAL EXAM: Patient is in no distress; well developed, nourished and groomed; neck is supple; CONTINUOUS HEAD, ARMS, LEGS, BODY TREMORS.  CARDIOVASCULAR: Regular rate and rhythm, no murmurs, no carotid bruits  NEUROLOGIC: MENTAL STATUS: awake, alert, oriented. MMSE 19/30. AFT 7. Language fluent, comprehension intact, naming  intact, fund of knowledge appropriate CRANIAL NERVE: pupils equal and reactive to light, visual fields full to confrontation, extraocular muscles intact, no nystagmus, facial sensation and strength symmetric, hearing intact, palate elevates symmetrically, uvula midline, shoulder shrug symmetric, tongue midline. POSITIVE SNOUT AND MYERSONS REFLEXES. MOTOR: RESTING > POSTURAL AND ACTION TREMOR OF BUE AND BLE. NO COGWHEELING. BRADYKINESIA IN LEFT > RIGHT SIDE (UE AND LE). Normal bulk, full strength in the BUE, BLE SENSORY: normal and symmetric to light touch COORDINATION: finger-nose-finger, fine finger movements SLOW AND WITH DYSMETRIA. REFLEXES: deep tendon reflexes present and symmetric GAIT/STATION: ABSENT ARM SWING ON RIGHT; TREMOR IN LEFT ARM WITH WALKING. SHORT STEPS. SLOW TURNING.    DIAGNOSTIC DATA (LABS, IMAGING, TESTING) - I reviewed patient records, labs, notes, testing and imaging myself where available.  Lab Results  Component Value Date   WBC 8.4 09/16/2014   HGB 15.9 09/16/2014   HCT 48.5 09/16/2014   MCV 97.7 09/16/2014   PLT 148.0* 09/16/2014      Component Value Date/Time   NA 140 09/16/2014 0840   K 5.3* 09/16/2014 0840   CL 106 09/16/2014 0840   CO2 24 09/16/2014 0840   GLUCOSE 111* 09/16/2014 0840   BUN 20 09/16/2014 0840   CREATININE 1.0 09/16/2014 0840   CREATININE 0.79 04/11/2014 1114   CALCIUM 8.9 09/16/2014 0840   PROT 6.7 09/16/2014 0840   ALBUMIN 3.9 09/16/2014 0840   ALBUMIN 3.9 09/16/2014 0840   AST 34 09/16/2014 0840   ALT 31 09/16/2014 0840   ALKPHOS 86 09/16/2014 0840   BILITOT 0.8 09/16/2014 0840   GFRNONAA 73* 05/22/2013 1010   GFRAA 85* 05/22/2013 1010   Lab Results  Component Value Date   CHOL 151 09/16/2014   HDL 66.30 09/16/2014   LDLCALC 76 09/16/2014   TRIG 46.0 09/16/2014   CHOLHDL 2 09/16/2014   Lab Results  Component Value Date   HGBA1C 5.6 12/14/2013   Lab Results  Component Value Date   VITAMINB12 738 03/31/2012    Lab Results  Component Value Date   TSH 1.80 09/16/2014  I reviewed images myself and agree with interpretation. -VRP  07/28/12 MRI brain - Normal for age noncontrast MRI appearance of the brain.   ASSESSMENT AND PLAN  73 y.o. male with memory loss, mixed tremor, cogwheel rigidity, masked facies, bradykinesia. Slight benefit with wine. Not much benefit with primidone, propranolol or carb/levo so far.   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 Continue donepezil 10 mg daily 2. Continue carb/levo 2 tabs TID 3. Continue increasing primidone up to 100mg  BID (per [facility] movement disorder clinic dosing)  Return in about 3 months (around 01/25/2015).   Suanne MarkerVIKRAM R. Yordy Matton, MD 10/25/2014, 2:41 PM Certified in Neurology, Neurophysiology and Neuroimaging  [facility] [address], [address] [place], [place] [address] [phone]

## 2014-10-25 NOTE — Patient Instructions (Signed)
Continue current medications. 

## 2014-12-01 ENCOUNTER — Other Ambulatory Visit: Payer: Self-pay | Admitting: Neurology

## 2015-01-26 ENCOUNTER — Ambulatory Visit: Payer: Medicare PPO | Admitting: Diagnostic Neuroimaging

## 2015-03-20 ENCOUNTER — Ambulatory Visit: Payer: Medicare PPO | Admitting: Family Medicine

## 2015-03-27 ENCOUNTER — Encounter: Payer: Self-pay | Admitting: Family Medicine

## 2015-03-27 ENCOUNTER — Ambulatory Visit (INDEPENDENT_AMBULATORY_CARE_PROVIDER_SITE_OTHER): Payer: Medicare PPO | Admitting: Family Medicine

## 2015-03-27 VITALS — BP 118/76 | HR 60 | Temp 97.6°F | Resp 16 | Ht 70.0 in | Wt 146.0 lb

## 2015-03-27 DIAGNOSIS — D696 Thrombocytopenia, unspecified: Secondary | ICD-10-CM | POA: Diagnosis not present

## 2015-03-27 DIAGNOSIS — R251 Tremor, unspecified: Secondary | ICD-10-CM | POA: Diagnosis not present

## 2015-03-27 DIAGNOSIS — E782 Mixed hyperlipidemia: Secondary | ICD-10-CM

## 2015-03-27 DIAGNOSIS — F0390 Unspecified dementia without behavioral disturbance: Secondary | ICD-10-CM

## 2015-03-27 DIAGNOSIS — R739 Hyperglycemia, unspecified: Secondary | ICD-10-CM | POA: Diagnosis not present

## 2015-03-27 LAB — COMPREHENSIVE METABOLIC PANEL
ALK PHOS: 109 U/L (ref 39–117)
ALT: 26 U/L (ref 0–53)
AST: 27 U/L (ref 0–37)
Albumin: 3.8 g/dL (ref 3.5–5.2)
BILIRUBIN TOTAL: 0.7 mg/dL (ref 0.2–1.2)
BUN: 14 mg/dL (ref 6–23)
CO2: 33 meq/L — AB (ref 19–32)
Calcium: 9 mg/dL (ref 8.4–10.5)
Chloride: 101 mEq/L (ref 96–112)
Creatinine, Ser: 0.74 mg/dL (ref 0.40–1.50)
GFR: 109.74 mL/min (ref >60.00)
Glucose, Bld: 86 mg/dL (ref 70–99)
Potassium: 4 mEq/L (ref 3.5–5.1)
SODIUM: 137 meq/L (ref 135–145)
TOTAL PROTEIN: 6.4 g/dL (ref 6.0–8.3)

## 2015-03-27 LAB — CBC
HCT: 45.5 % (ref 39.0–52.0)
Hemoglobin: 15.5 g/dL (ref 13.0–17.0)
MCHC: 34.1 g/dL (ref 30.0–36.0)
MCV: 94.8 fl (ref 78.0–100.0)
Platelets: 213 10*3/uL (ref 150.0–400.0)
RBC: 4.8 Mil/uL (ref 4.22–5.81)
RDW: 13.5 % (ref 11.5–15.5)
WBC: 5.7 10*3/uL (ref 4.0–10.5)

## 2015-03-27 LAB — TSH: TSH: 2.94 u[IU]/mL (ref 0.35–4.50)

## 2015-03-27 LAB — LIPID PANEL
Cholesterol: 162 mg/dL (ref 0–200)
HDL: 69.3 mg/dL (ref >39.00)
LDL Cholesterol: 80 mg/dL (ref 0–99)
NonHDL: 92.7
TRIGLYCERIDES: 65 mg/dL (ref 0.0–149.0)
Total CHOL/HDL Ratio: 2
VLDL: 13 mg/dL (ref 0.0–40.0)

## 2015-03-27 LAB — HEMOGLOBIN A1C: Hgb A1c MFr Bld: 5.5 % (ref 4.6–6.5)

## 2015-03-27 NOTE — Patient Instructions (Signed)

## 2015-04-02 ENCOUNTER — Encounter: Payer: Self-pay | Admitting: Family Medicine

## 2015-04-02 NOTE — Assessment & Plan Note (Signed)
hgba1c acceptable, minimize simple carbs. Increase exercise as tolerated. Continue current meds 

## 2015-04-02 NOTE — Progress Notes (Signed)
Brandon QuailMorrell B Schoeppner Jr.  [id number] 03/30/1941 04/02/2015      Progress Note-Follow Up  Subjective  Chief Complaint  Chief Complaint  Patient presents with  . Follow-up    HPI  Patient is a 74 y.o. male in today for routine medical care. Patient is in today for follow-up accompanied by his wife. They report overall he is doing well at the present time. He is eating acceptably well. No recent illness. Continues to follow with neurology. His tremor and memory loss are stable. [facility]. Denies CP/palp/SOB/HA/congestion/fevers/GI or GU c/o. Taking meds as prescribed  Past Medical History  Diagnosis Date  . Asthma     childhood- age 89  . History of chicken pox     childhood  . Ulcer of abdomen wall 1988  . History of blood transfusion 1988  . Acute bronchitis 04/14/2013  . Dementia   . COPD (chronic obstructive pulmonary disease) 05/02/2013  . Elevated PSA 08/22/2013  . Other and unspecified hyperlipidemia 08/22/2013  . 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 Abdominal surgery  1988    bleed ulcer  . Tonsillectomy    . Hernia repair  2013    Family History  Problem Relation Age of Onset  . Colon cancer Neg Hx   . Heart disease Neg Hx   . Prostate cancer Neg Hx   . Breast cancer Neg Hx   . Diabetes Neg Hx   . Hypertension Mother   . Dementia Mother   . Other Father     GI infection/disturbance  . Tremor Father   . COPD Sister     smoker  . Tremor Sister   . Tremor Brother   . Tremor Sister     History   Social History  . Marital Status: Married    Spouse Name: martha  . Number of Children: 2  . Years of Education: MED   Occupational History  . retired    Social History Main Topics  . Smoking status: Former Games developermoker  . Smokeless tobacco: Never Used     Comment: 1 ppd for 30 years  . Alcohol Use: Yes     Comment: wine  . Drug Use: No  . Sexual Activity: Yes   Comment: lives with wife, no major dietary restrictions    Other Topics Concern  . Not on file   Social History Narrative   Patient lives at home with spouse.   Caffeine Use: 2 cups daily    Current Outpatient Prescriptions on File Prior to Visit  Medication Sig Dispense Refill  . albuterol (PROVENTIL HFA;VENTOLIN HFA) 108 (90 BASE) MCG/ACT inhaler Inhale 2 puffs into the lungs every 6 (six) hours as needed for wheezing or shortness of breath. 1 Inhaler 5  . aspirin 81 MG tablet Take 81 mg by mouth daily.    Marland Kitchen. atorvastatin (LIPITOR) 10 MG tablet TAKE 1 TABLET (10 MG TOTAL) BY MOUTH DAILY. 90 tablet 3  . CIALIS 20 MG tablet TAKE 1 TABLET BY MOUTH EVERY 36 HOURS AS NEEDED 5 tablet 3  . clonazePAM (KLONOPIN) 0.5 MG tablet Take 1 tablet (0.5 mg total) by mouth daily as needed for anxiety. 10 tablet 3  . donepezil (ARICEPT) 10 MG tablet Take 10 mg by mouth at bedtime.    . donepezil (ARICEPT) 10 MG tablet TAKE 1 TABLET BY MOUTH AT BEDTIME 90 tablet 3  . escitalopram (LEXAPRO)  10 MG tablet TAKE 1 TABLET EVERY DAY 90 tablet 2  . ipratropium (ATROVENT) 0.03 % nasal spray     . ondansetron (ZOFRAN-ODT) 4 MG disintegrating tablet Take 1 tablet (4 mg total) by mouth every 8 (eight) hours as needed for nausea. 30 tablet 3  . primidone (MYSOLINE) 50 MG tablet Tapering scale     No current facility-administered medications on file prior to visit.    No Known Allergies  Review of Systems  Review of Systems  Constitutional: Negative for fever and malaise/fatigue.  HENT: Negative for congestion.   Eyes: Negative for discharge.  Respiratory: Negative for shortness of breath.   Cardiovascular: Negative for chest pain, palpitations and leg swelling.  Gastrointestinal: Negative for nausea, abdominal pain and diarrhea.  Genitourinary: Negative for dysuria.  Musculoskeletal: Negative for falls.  Skin: Negative for rash.  Neurological: Positive for tremors. Negative for loss of consciousness and  headaches.  Endo/Heme/Allergies: Negative for polydipsia.  Psychiatric/Behavioral: Positive for memory loss. Negative for depression and suicidal ideas. The patient is nervous/anxious. The patient does not have insomnia.     Objective  BP 118/76 mmHg  Pulse 60  Temp(Src) 97.6 F (36.4 C) (Oral)  Resp 16  Ht  (1.778 m)  Wt 146 lb (66.225 kg)  BMI 20.95 kg/m2  SpO2 98%  Physical Exam  Physical Exam  Constitutional: He is oriented to person, place, and time and well-developed, well-nourished, and in no distress. No distress.  HENT:  Head: Normocephalic and atraumatic.  Eyes: Conjunctivae are normal.  Neck: Neck supple. No thyromegaly present.  Cardiovascular: Normal rate, regular rhythm and normal heart sounds.   No murmur heard. Pulmonary/Chest: Effort normal and breath sounds normal. No respiratory distress.  Abdominal: He exhibits no distension and no mass. There is no tenderness.  Musculoskeletal: He exhibits no edema.  Neurological: He is alert and oriented to person, place, and time.  Skin: Skin is warm.  Psychiatric: Memory, affect and judgment normal.    Lab Results  Component Value Date   TSH 2.94 03/27/2015   Lab Results  Component Value Date   WBC 5.7 03/27/2015   HGB 15.5 03/27/2015   HCT 45.5 03/27/2015   MCV 94.8 03/27/2015   PLT 213.0 03/27/2015   Lab Results  Component Value Date   CREATININE 0.74 03/27/2015   BUN 14 03/27/2015   NA 137 03/27/2015   K 4.0 03/27/2015   CL 101 03/27/2015   CO2 33* 03/27/2015   Lab Results  Component Value Date   ALT 26 03/27/2015   AST 27 03/27/2015   ALKPHOS 109 03/27/2015   BILITOT 0.7 03/27/2015   Lab Results  Component Value Date   CHOL 162 03/27/2015   Lab Results  Component Value Date   HDL 69.30 03/27/2015   Lab Results  Component Value Date   LDLCALC 80 03/27/2015   Lab Results  Component Value Date   TRIG 65.0 03/27/2015   Lab Results  Component Value Date   CHOLHDL 2  03/27/2015     Assessment & Plan  Hyperlipidemia, mixed Tolerating statin, encouraged heart healthy diet, avoid trans fats, minimize simple carbs and saturated fats. Increase exercise as tolerated   Dementia Stable on Donepezil, following with neurology   Hyperglycemia hgba1c acceptable, minimize simple carbs. Increase exercise as tolerated. Continue current meds

## 2015-04-02 NOTE — Assessment & Plan Note (Signed)
Stable on Donepezil, following with neurology

## 2015-04-02 NOTE — Assessment & Plan Note (Signed)
Tolerating statin, encouraged heart healthy diet, avoid trans fats, minimize simple carbs and saturated fats. Increase exercise as tolerated 

## 2015-06-23 DIAGNOSIS — C61 Malignant neoplasm of prostate: Secondary | ICD-10-CM | POA: Diagnosis not present

## 2015-06-30 DIAGNOSIS — C61 Malignant neoplasm of prostate: Secondary | ICD-10-CM | POA: Diagnosis not present

## 2015-07-31 DIAGNOSIS — Z8546 Personal history of malignant neoplasm of prostate: Secondary | ICD-10-CM | POA: Diagnosis not present

## 2015-07-31 DIAGNOSIS — F0390 Unspecified dementia without behavioral disturbance: Secondary | ICD-10-CM | POA: Diagnosis not present

## 2015-07-31 DIAGNOSIS — Z7982 Long term (current) use of aspirin: Secondary | ICD-10-CM | POA: Diagnosis not present

## 2015-07-31 DIAGNOSIS — F419 Anxiety disorder, unspecified: Secondary | ICD-10-CM | POA: Diagnosis not present

## 2015-07-31 DIAGNOSIS — G250 Essential tremor: Secondary | ICD-10-CM | POA: Diagnosis not present

## 2015-07-31 DIAGNOSIS — E785 Hyperlipidemia, unspecified: Secondary | ICD-10-CM | POA: Diagnosis not present

## 2015-07-31 DIAGNOSIS — G20 Parkinson's disease: Secondary | ICD-10-CM | POA: Diagnosis not present

## 2015-08-23 DIAGNOSIS — Z7982 Long term (current) use of aspirin: Secondary | ICD-10-CM | POA: Diagnosis not present

## 2015-08-23 DIAGNOSIS — F419 Anxiety disorder, unspecified: Secondary | ICD-10-CM | POA: Diagnosis not present

## 2015-08-23 DIAGNOSIS — F0390 Unspecified dementia without behavioral disturbance: Secondary | ICD-10-CM | POA: Diagnosis not present

## 2015-08-23 DIAGNOSIS — G20 Parkinson's disease: Secondary | ICD-10-CM | POA: Diagnosis not present

## 2015-08-23 DIAGNOSIS — G250 Essential tremor: Secondary | ICD-10-CM | POA: Diagnosis not present

## 2015-08-25 ENCOUNTER — Other Ambulatory Visit: Payer: Self-pay | Admitting: Physician Assistant

## 2015-08-25 NOTE — Telephone Encounter (Signed)
Will forward to PCP for refill. 

## 2015-09-26 ENCOUNTER — Ambulatory Visit (INDEPENDENT_AMBULATORY_CARE_PROVIDER_SITE_OTHER): Payer: Medicare PPO | Admitting: Family Medicine

## 2015-09-26 ENCOUNTER — Encounter: Payer: Self-pay | Admitting: Family Medicine

## 2015-09-26 VITALS — BP 127/78 | HR 53 | Temp 97.9°F | Ht 70.0 in | Wt 144.5 lb

## 2015-09-26 DIAGNOSIS — F0390 Unspecified dementia without behavioral disturbance: Secondary | ICD-10-CM

## 2015-09-26 DIAGNOSIS — E782 Mixed hyperlipidemia: Secondary | ICD-10-CM

## 2015-09-26 DIAGNOSIS — R739 Hyperglycemia, unspecified: Secondary | ICD-10-CM | POA: Diagnosis not present

## 2015-09-26 DIAGNOSIS — Z0000 Encounter for general adult medical examination without abnormal findings: Secondary | ICD-10-CM

## 2015-09-26 LAB — COMPREHENSIVE METABOLIC PANEL
ALT: 30 U/L (ref 0–53)
AST: 27 U/L (ref 0–37)
Albumin: 3.8 g/dL (ref 3.5–5.2)
Alkaline Phosphatase: 98 U/L (ref 39–117)
BUN: 21 mg/dL (ref 6–23)
CO2: 34 meq/L — AB (ref 19–32)
Calcium: 9 mg/dL (ref 8.4–10.5)
Chloride: 104 mEq/L (ref 96–112)
Creatinine, Ser: 0.74 mg/dL (ref 0.40–1.50)
GFR: 109.59 mL/min (ref >60.00)
GLUCOSE: 101 mg/dL — AB (ref 70–99)
POTASSIUM: 4.4 meq/L (ref 3.5–5.1)
Sodium: 141 mEq/L (ref 135–145)
Total Bilirubin: 0.5 mg/dL (ref 0.2–1.2)
Total Protein: 6.4 g/dL (ref 6.0–8.3)

## 2015-09-26 LAB — LIPID PANEL
CHOL/HDL RATIO: 2
Cholesterol: 151 mg/dL (ref 0–200)
HDL: 69.7 mg/dL (ref >39.00)
LDL CALC: 69 mg/dL (ref 0–99)
NonHDL: 80.91
TRIGLYCERIDES: 62 mg/dL (ref 0.0–149.0)
VLDL: 12.4 mg/dL (ref 0.0–40.0)

## 2015-09-26 LAB — CBC
HEMATOCRIT: 46.3 % (ref 39.0–52.0)
Hemoglobin: 15.5 g/dL (ref 13.0–17.0)
MCHC: 33.4 g/dL (ref 30.0–36.0)
MCV: 97 fl (ref 78.0–100.0)
Platelets: 186 10*3/uL (ref 150.0–400.0)
RBC: 4.78 Mil/uL (ref 4.22–5.81)
RDW: 14.2 % (ref 11.5–15.5)
WBC: 5.5 10*3/uL (ref 4.0–10.5)

## 2015-09-26 LAB — MICROALBUMIN / CREATININE URINE RATIO
Creatinine,U: 201 mg/dL
Microalb Creat Ratio: 0.3 mg/g (ref 0.0–30.0)
Microalb, Ur: <0.7 mg/dL (ref 0.0–1.9)

## 2015-09-26 LAB — TSH: TSH: 1.37 u[IU]/mL (ref 0.35–4.50)

## 2015-09-26 LAB — HEMOGLOBIN A1C: Hgb A1c MFr Bld: 5.4 % (ref 4.6–6.5)

## 2015-09-26 NOTE — Progress Notes (Signed)
Patient ID: Brandon Appleyard., male   DOB: 1941-01-22, 74 y.o.   MRN: [id number]   Subjective:    Patient ID: Brandon Quail., male    DOB: 1940-12-29, 74 y.o.   MRN: [id number]  Chief Complaint  Patient presents with  . Medicare Wellness    HPI Patient is in today for annual wellness exam and follow-up. He is accompanied by his wife. No recent illness but his dementia slowly worsens. He is doing well at home with his activities of daily living and his wife takes care of the major chores around the house. No acute concerns. No recent hospitalizations. Denies CP/palp/SOB/HA/congestion/fevers/GI or GU c/o. Taking meds as prescribed  Past Medical History  Diagnosis Date  . Asthma     childhood- age 2  . History of chicken pox     childhood  . Ulcer of abdomen wall (HCC) 1988  . History of blood transfusion 1988  . Acute bronchitis 04/14/2013  . Dementia   . COPD (chronic obstructive pulmonary disease) (HCC) 05/02/2013  . Elevated PSA 08/22/2013  . Other and unspecified hyperlipidemia 08/22/2013  . 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 Medicare annual wellness visit, subsequent 10/01/2015    Past Surgical History  Procedure Laterality Date  . Abdominal surgery  1988    bleed ulcer  . Tonsillectomy    . Hernia repair  2013    Family History  Problem Relation Age of Onset  . Colon cancer Neg Hx   . Heart disease Neg Hx   . Prostate cancer Neg Hx   . Breast cancer Neg Hx   . Diabetes Neg Hx   . Hypertension Mother   . Dementia Mother   . Other Father     GI infection/disturbance  . Tremor Father   . COPD Sister     smoker  . Tremor Sister   . Tremor Brother   . Tremor Sister     Social History   Social History  . Marital Status: Married    Spouse Name: martha  . Number of Children: 2  . Years of Education: MED   Occupational History  . retired    Social History Main Topics  . Smoking status: Former Games developer  . Smokeless tobacco: Never Used     Comment: 1  ppd for 30 years  . Alcohol Use: Yes     Comment: wine  . Drug Use: No  . Sexual Activity: Yes     Comment: lives with wife, no major dietary restrictions    Other Topics Concern  . Not on file   Social History Narrative   Patient lives at home with spouse.   Caffeine Use: 2 cups daily    Outpatient Prescriptions Prior to Visit  Medication Sig Dispense Refill  . albuterol (PROVENTIL HFA;VENTOLIN HFA) 108 (90 BASE) MCG/ACT inhaler Inhale 2 puffs into the lungs every 6 (six) hours as needed for wheezing or shortness of breath. 1 Inhaler 5  . aspirin 81 MG tablet Take 81 mg by mouth daily.    Marland Kitchen atorvastatin (LIPITOR) 10 MG tablet TAKE 1 TABLET BY MOUTH ONCE DAILY 90 tablet 2  . carbidopa-levodopa (SINEMET IR) 25-100 MG per tablet Take 2 tablets by mouth 3 (three) times daily.    Marland Kitchen CIALIS 20 MG tablet TAKE 1 TABLET BY MOUTH EVERY 36 HOURS AS NEEDED 5 tablet 3  . clonazePAM (KLONOPIN) 0.5 MG tablet Take 1 tablet (0.5 mg total) by mouth daily as needed for anxiety. 10  tablet 3  . donepezil (ARICEPT) 10 MG tablet TAKE 1 TABLET BY MOUTH AT BEDTIME 90 tablet 3  . escitalopram (LEXAPRO) 10 MG tablet TAKE 1 TABLET EVERY DAY 90 tablet 2  . ipratropium (ATROVENT) 0.03 % nasal spray     . ondansetron (ZOFRAN-ODT) 4 MG disintegrating tablet Take 1 tablet (4 mg total) by mouth every 8 (eight) hours as needed for nausea. 30 tablet 3  . primidone (MYSOLINE) 50 MG tablet Tapering scale    . donepezil (ARICEPT) 10 MG tablet Take 10 mg by mouth at bedtime.     No facility-administered medications prior to visit.    No Known Allergies  Review of Systems  Constitutional: Negative for fever and malaise/fatigue.  HENT: Negative for congestion.   Eyes: Negative for discharge.  Respiratory: Negative for shortness of breath.   Cardiovascular: Negative for chest pain, palpitations and leg swelling.  Gastrointestinal: Negative for nausea and abdominal pain.  Genitourinary: Negative for dysuria.    Musculoskeletal: Negative for falls.  Skin: Negative for rash.  Neurological: Negative for loss of consciousness and headaches.  Endo/Heme/Allergies: Negative for environmental allergies.  Psychiatric/Behavioral: Positive for memory loss. Negative for depression. The patient is not nervous/anxious.        Objective:    Physical Exam  Constitutional: He is oriented to person, place, and time. He appears well-developed and well-nourished. No distress.  HENT:  Head: Normocephalic and atraumatic.  Eyes: Conjunctivae are normal.  Neck: Neck supple. No thyromegaly present.  Cardiovascular: Normal rate, regular rhythm and normal heart sounds.   No murmur heard. Pulmonary/Chest: Effort normal and breath sounds normal. No respiratory distress. He has no wheezes.  Abdominal: Soft. Bowel sounds are normal. He exhibits no mass. There is no tenderness.  Musculoskeletal: He exhibits no edema.  Lymphadenopathy:    He has no cervical adenopathy.  Neurological: He is alert and oriented to person, place, and time.  Skin: Skin is warm and dry.  Psychiatric: He has a normal mood and affect. His behavior is normal.    BP 127/78 mmHg  Pulse 53  Temp(Src) 97.9 F (36.6 C) (Oral)  Ht  (1.778 m)  Wt 144 lb 8 oz (65.545 kg)  BMI 20.73 kg/m2  SpO2 97% Wt Readings from Last 3 Encounters:  09/26/15 144 lb 8 oz (65.545 kg)  03/27/15 146 lb (66.225 kg)  10/25/14 150 lb 9.6 oz (68.312 kg)     Lab Results  Component Value Date   WBC 5.5 09/26/2015   HGB 15.5 09/26/2015   HCT 46.3 09/26/2015   PLT 186.0 09/26/2015   GLUCOSE 101* 09/26/2015   CHOL 151 09/26/2015   TRIG 62.0 09/26/2015   HDL 69.70 09/26/2015   LDLCALC 69 09/26/2015   ALT 30 09/26/2015   AST 27 09/26/2015   NA 141 09/26/2015   K 4.4 09/26/2015   CL 104 09/26/2015   CREATININE 0.74 09/26/2015   BUN 21 09/26/2015   CO2 34* 09/26/2015   TSH 1.37 09/26/2015   PSA 4.14* 04/30/2013   HGBA1C 5.4 09/26/2015   MICROALBUR  <0.7 09/26/2015    Lab Results  Component Value Date   TSH 1.37 09/26/2015   Lab Results  Component Value Date   WBC 5.5 09/26/2015   HGB 15.5 09/26/2015   HCT 46.3 09/26/2015   MCV 97.0 09/26/2015   PLT 186.0 09/26/2015   Lab Results  Component Value Date   NA 141 09/26/2015   K 4.4 09/26/2015   CO2 34* 09/26/2015  GLUCOSE 101* 09/26/2015   BUN 21 09/26/2015   CREATININE 0.74 09/26/2015   BILITOT 0.5 09/26/2015   ALKPHOS 98 09/26/2015   AST 27 09/26/2015   ALT 30 09/26/2015   PROT 6.4 09/26/2015   ALBUMIN 3.8 09/26/2015   CALCIUM 9.0 09/26/2015   GFR 109.59 09/26/2015   Lab Results  Component Value Date   CHOL 151 09/26/2015   Lab Results  Component Value Date   HDL 69.70 09/26/2015   Lab Results  Component Value Date   LDLCALC 69 09/26/2015   Lab Results  Component Value Date   TRIG 62.0 09/26/2015   Lab Results  Component Value Date   CHOLHDL 2 09/26/2015   Lab Results  Component Value Date   HGBA1C 5.4 09/26/2015       Assessment & Plan:  Hyperglycemia hgba1c acceptable, minimize simple carbs. Increase exercise as tolerated.   Hyperlipidemia, mixed Tolerating statin, encouraged heart healthy diet, avoid trans fats, minimize simple carbs and saturated fats. Increase exercise as tolerated  Dementia He is accompanied by his wife and they report he is doing well at home with her  Medicare annual wellness visit, subsequent Patient denies any difficulties at home. No trouble with ADLs, depression or falls. No recent changes to vision or hearing. Is UTD with immunizations. Is UTD with screening. Discussed Advanced Directives, patient agrees to bring [place] copies of documents if can. Encouraged heart healthy diet, exercise as tolerated and adequate sleep. Labs reviewed Follows with [facility], Dr Baldo Ash of neurology Follows with [facility], Dr Isabel Caprice They decline further colonoscopies for screening purposes See problem list for risk factors.  See  AVS for possible health maintenance screening moving forward.     I am having Mr. Mezera maintain his aspirin, CIALIS, albuterol, ipratropium, clonazePAM, ondansetron, escitalopram, primidone, donepezil, carbidopa-levodopa, and atorvastatin.  No orders of the defined types were placed in this encounter.     [name], MD

## 2015-09-26 NOTE — Patient Instructions (Signed)
Preventive Care for Adults A healthy lifestyle and preventive care can promote health and wellness. Preventive health guidelines for men include the following key practices:  A routine yearly physical is a good way to check with your health care provider about your health and preventative screening. It is a chance to share any concerns and updates on your health and to receive a thorough exam.  Visit your dentist for a routine exam and preventative care every 6 months. Brush your teeth twice a day and floss once a day. Good oral hygiene prevents tooth decay and gum disease.  The frequency of eye exams is based on your age, health, family medical history, use of contact lenses, and other factors. Follow your health care provider's recommendations for frequency of eye exams.  Eat a healthy diet. Foods such as vegetables, fruits, whole grains, low-fat dairy products, and lean protein foods contain the nutrients you need without too many calories. Decrease your intake of foods high in solid fats, added sugars, and salt. Eat the right amount of calories for you.Get information about a proper diet from your health care provider, if necessary.  Regular physical exercise is one of the most important things you can do for your health. Most adults should get at least 150 minutes of moderate-intensity exercise (any activity that increases your heart rate and causes you to sweat) each week. In addition, most adults need muscle-strengthening exercises on 2 or more days a week.  Maintain a healthy weight. The body mass index (BMI) is a screening tool to identify possible weight problems. It provides an estimate of body fat based on height and weight. Your health care provider can find your BMI and can help you achieve or maintain a healthy weight.For adults 20 years and older:  A BMI below 18.5 is considered underweight.  A BMI of 18.5 to 24.9 is normal.  A BMI of 25 to 29.9 is considered overweight.  A BMI  of 30 and above is considered obese.  Maintain normal blood lipids and cholesterol levels by exercising and minimizing your intake of saturated fat. Eat a balanced diet with plenty of fruit and vegetables. Blood tests for lipids and cholesterol should begin at age 50 and be repeated every 5 years. If your lipid or cholesterol levels are high, you are over 50, or you are at high risk for heart disease, you may need your cholesterol levels checked more frequently.Ongoing high lipid and cholesterol levels should be treated with medicines if diet and exercise are not working.  If you smoke, find out from your health care provider how to quit. If you do not use tobacco, do not start.  Lung cancer screening is recommended for adults aged 73-80 years who are at high risk for developing lung cancer because of a history of smoking. A yearly low-dose CT scan of the lungs is recommended for people who have at least a 30-pack-year history of smoking and are a current smoker or have quit within the past 15 years. A pack year of smoking is smoking an average of 1 pack of cigarettes a day for 1 year (for example: 1 pack a day for 30 years or 2 packs a day for 15 years). Yearly screening should continue until the smoker has stopped smoking for at least 15 years. Yearly screening should be stopped for people who develop a health problem that would prevent them from having lung cancer treatment.  If you choose to drink alcohol, do not have more than  2 drinks per day. One drink is considered to be 12 ounces (355 mL) of beer, 5 ounces (148 mL) of wine, or 1.5 ounces (44 mL) of liquor.  Avoid use of street drugs. Do not share needles with anyone. Ask for help if you need support or instructions about stopping the use of drugs.  High blood pressure causes heart disease and increases the risk of stroke. Your blood pressure should be checked at least every 1-2 years. Ongoing high blood pressure should be treated with  medicines, if weight loss and exercise are not effective.  If you are 45-79 years old, ask your health care provider if you should take aspirin to prevent heart disease.  Diabetes screening involves taking a blood sample to check your fasting blood sugar level. This should be done once every 3 years, after age 45, if you are within normal weight and without risk factors for diabetes. Testing should be considered at a younger age or be carried out more frequently if you are overweight and have at least 1 risk factor for diabetes.  Colorectal cancer can be detected and often prevented. Most routine colorectal cancer screening begins at the age of 50 and continues through age 75. However, your health care provider may recommend screening at an earlier age if you have risk factors for colon cancer. On a yearly basis, your health care provider may provide home test kits to check for hidden blood in the stool. Use of a small camera at the end of a tube to directly examine the colon (sigmoidoscopy or colonoscopy) can detect the earliest forms of colorectal cancer. Talk to your health care provider about this at age 50, when routine screening begins. Direct exam of the colon should be repeated every 5-10 years through age 75, unless early forms of precancerous polyps or small growths are found.  People who are at an increased risk for hepatitis B should be screened for this virus. You are considered at high risk for hepatitis B if:  You were born in a country where hepatitis B occurs often. Talk with your health care provider about which countries are considered high risk.  Your parents were born in a high-risk country and you have not received a shot to protect against hepatitis B (hepatitis B vaccine).  You have HIV or AIDS.  You use needles to inject street drugs.  You live with, or have sex with, someone who has hepatitis B.  You are a man who has sex with other men (MSM).  You get hemodialysis  treatment.  You take certain medicines for conditions such as cancer, organ transplantation, and autoimmune conditions.  Hepatitis C blood testing is recommended for all people born from 1945 through 1965 and any individual with known risks for hepatitis C.  Practice safe sex. Use condoms and avoid high-risk sexual practices to reduce the spread of sexually transmitted infections (STIs). STIs include gonorrhea, chlamydia, syphilis, trichomonas, herpes, HPV, and human immunodeficiency virus (HIV). Herpes, HIV, and HPV are viral illnesses that have no cure. They can result in disability, cancer, and death.  If you are at risk of being infected with HIV, it is recommended that you take a prescription medicine daily to prevent HIV infection. This is called preexposure prophylaxis (PrEP). You are considered at risk if:  You are a man who has sex with other men (MSM) and have other risk factors.  You are a heterosexual man, are sexually active, and are at increased risk for HIV infection.    You take drugs by injection.  You are sexually active with a partner who has HIV.  Talk with your health care provider about whether you are at high risk of being infected with HIV. If you choose to begin PrEP, you should first be tested for HIV. You should then be tested every 3 months for as long as you are taking PrEP.  A one-time screening for abdominal aortic aneurysm (AAA) and surgical repair of large AAAs by ultrasound are recommended for men ages 32 to 67 years who are current or former smokers.  Healthy men should no longer receive prostate-specific antigen (PSA) blood tests as part of routine cancer screening. Talk with your health care provider about prostate cancer screening.  Testicular cancer screening is not recommended for adult males who have no symptoms. Screening includes self-exam, a health care provider exam, and other screening tests. Consult with your health care provider about any symptoms  you have or any concerns you have about testicular cancer.  Use sunscreen. Apply sunscreen liberally and repeatedly throughout the day. You should seek shade when your shadow is shorter than you. Protect yourself by wearing long sleeves, pants, a wide-brimmed hat, and sunglasses year round, whenever you are outdoors.  Once a month, do a whole-body skin exam, using a mirror to look at the skin on your back. Tell your health care provider about new moles, moles that have irregular borders, moles that are larger than a pencil eraser, or moles that have changed in shape or color.  Stay current with required vaccines (immunizations).  Influenza vaccine. All adults should be immunized every year.  Tetanus, diphtheria, and acellular pertussis (Td, Tdap) vaccine. An adult who has not previously received Tdap or who does not know his vaccine status should receive 1 dose of Tdap. This initial dose should be followed by tetanus and diphtheria toxoids (Td) booster doses every 10 years. Adults with an unknown or incomplete history of completing a 3-dose immunization series with Td-containing vaccines should begin or complete a primary immunization series including a Tdap dose. Adults should receive a Td booster every 10 years.  Varicella vaccine. An adult without evidence of immunity to varicella should receive 2 doses or a second dose if he has previously received 1 dose.  Human papillomavirus (HPV) vaccine. Males aged 68-21 years who have not received the vaccine previously should receive the 3-dose series. Males aged 22-26 years may be immunized. Immunization is recommended through the age of 6 years for any male who has sex with males and did not get any or all doses earlier. Immunization is recommended for any person with an immunocompromised condition through the age of 49 years if he did not get any or all doses earlier. During the 3-dose series, the second dose should be obtained 4-8 weeks after the first  dose. The third dose should be obtained 24 weeks after the first dose and 16 weeks after the second dose.  Zoster vaccine. One dose is recommended for adults aged 50 years or older unless certain conditions are present.  Measles, mumps, and rubella (MMR) vaccine. Adults born before [birth year] generally are considered immune to measles and mumps. Adults born in [birth year] or later should have 1 or more doses of MMR vaccine unless there is a contraindication to the vaccine or there is laboratory evidence of immunity to each of the three diseases. A routine second dose of MMR vaccine should be obtained at least 28 days after the first dose for students attending postsecondary  schools, health care workers, or international travelers. People who received inactivated measles vaccine or an unknown type of measles vaccine during 1963-1967 should receive 2 doses of MMR vaccine. People who received inactivated mumps vaccine or an unknown type of mumps vaccine before 1979 and are at high risk for mumps infection should consider immunization with 2 doses of MMR vaccine. Unvaccinated health care workers born before 1957 who lack laboratory evidence of measles, mumps, or rubella immunity or laboratory confirmation of disease should consider measles and mumps immunization with 2 doses of MMR vaccine or rubella immunization with 1 dose of MMR vaccine.  Pneumococcal 13-valent conjugate (PCV13) vaccine. When indicated, a person who is uncertain of his immunization history and has no record of immunization should receive the PCV13 vaccine. An adult aged 19 years or older who has certain medical conditions and has not been previously immunized should receive 1 dose of PCV13 vaccine. This PCV13 should be followed with a dose of pneumococcal polysaccharide (PPSV23) vaccine. The PPSV23 vaccine dose should be obtained at least 8 weeks after the dose of PCV13 vaccine. An adult aged 19 years or older who has certain medical conditions and  previously received 1 or more doses of PPSV23 vaccine should receive 1 dose of PCV13. The PCV13 vaccine dose should be obtained 1 or more years after the last PPSV23 vaccine dose.  Pneumococcal polysaccharide (PPSV23) vaccine. When PCV13 is also indicated, PCV13 should be obtained first. All adults aged 65 years and older should be immunized. An adult younger than age 65 years who has certain medical conditions should be immunized. Any person who resides in a nursing home or long-term care facility should be immunized. An adult smoker should be immunized. People with an immunocompromised condition and certain other conditions should receive both PCV13 and PPSV23 vaccines. People with human immunodeficiency virus (HIV) infection should be immunized as soon as possible after diagnosis. Immunization during chemotherapy or radiation therapy should be avoided. Routine use of PPSV23 vaccine is not recommended for American Indians, [place] Natives, or people younger than 65 years unless there are medical conditions that require PPSV23 vaccine. When indicated, people who have unknown immunization and have no record of immunization should receive PPSV23 vaccine. One-time revaccination 5 years after the first dose of PPSV23 is recommended for people aged 19-64 years who have chronic kidney failure, nephrotic syndrome, asplenia, or immunocompromised conditions. People who received 1-2 doses of PPSV23 before age 65 years should receive another dose of PPSV23 vaccine at age 65 years or later if at least 5 years have passed since the previous dose. Doses of PPSV23 are not needed for people immunized with PPSV23 at or after age 65 years.  Meningococcal vaccine. Adults with asplenia or persistent complement component deficiencies should receive 2 doses of quadrivalent meningococcal conjugate (MenACWY-D) vaccine. The doses should be obtained at least 2 months apart. Microbiologists working with certain meningococcal bacteria,  military recruits, people at risk during an outbreak, and people who travel to or live in countries with a high rate of meningitis should be immunized. A first-year college student up through age 21 years who is living in a residence hall should receive a dose if he did not receive a dose on or after his 16th birthday. Adults who have certain high-risk conditions should receive one or more doses of vaccine.  Hepatitis A vaccine. Adults who wish to be protected from this disease, have certain high-risk conditions, work with hepatitis A-infected animals, work in hepatitis A research labs, or   travel to or work in countries with a high rate of hepatitis A should be immunized. Adults who were previously unvaccinated and who anticipate close contact with an international adoptee during the first 60 days after arrival in the [place] from a country with a high rate of hepatitis A should be immunized.  Hepatitis B vaccine. Adults should be immunized if they wish to be protected from this disease, have certain high-risk conditions, may be exposed to blood or other infectious body fluids, are household contacts or sex partners of hepatitis B positive people, are clients or workers in certain care facilities, or travel to or work in countries with a high rate of hepatitis B.  Haemophilus influenzae type b (Hib) vaccine. A previously unvaccinated person with asplenia or sickle cell disease or having a scheduled splenectomy should receive 1 dose of Hib vaccine. Regardless of previous immunization, a recipient of a hematopoietic stem cell transplant should receive a 3-dose series 6-12 months after his successful transplant. Hib vaccine is not recommended for adults with HIV infection. Preventive Service / Frequency Ages 52 to 17  Blood pressure check.** / Every 1 to 2 years.  Lipid and cholesterol check.** / Every 5 years beginning at age 69.  Hepatitis C blood test.** / For any individual with known risks for  hepatitis C.  Skin self-exam. / Monthly.  Influenza vaccine. / Every year.  Tetanus, diphtheria, and acellular pertussis (Tdap, Td) vaccine.** / Consult your health care provider. 1 dose of Td every 10 years.  Varicella vaccine.** / Consult your health care provider.  HPV vaccine. / 3 doses over 6 months, if 72 or younger.  Measles, mumps, rubella (MMR) vaccine.** / You need at least 1 dose of MMR if you were born in 1957 or later. You may also need a second dose.  Pneumococcal 13-valent conjugate (PCV13) vaccine.** / Consult your health care provider.  Pneumococcal polysaccharide (PPSV23) vaccine.** / 1 to 2 doses if you smoke cigarettes or if you have certain conditions.  Meningococcal vaccine.** / 1 dose if you are age 35 to 60 years and a Market researcher living in a residence hall, or have one of several medical conditions. You may also need additional booster doses.  Hepatitis A vaccine.** / Consult your health care provider.  Hepatitis B vaccine.** / Consult your health care provider.  Haemophilus influenzae type b (Hib) vaccine.** / Consult your health care provider. Ages 35 to 8  Blood pressure check.** / Every 1 to 2 years.  Lipid and cholesterol check.** / Every 5 years beginning at age 57.  Lung cancer screening. / Every year if you are aged 44-80 years and have a 30-pack-year history of smoking and currently smoke or have quit within the past 15 years. Yearly screening is stopped once you have quit smoking for at least 15 years or develop a health problem that would prevent you from having lung cancer treatment.  Fecal occult blood test (FOBT) of stool. / Every year beginning at age 55 and continuing until age 73. You may not have to do this test if you get a colonoscopy every 10 years.  Flexible sigmoidoscopy** or colonoscopy.** / Every 5 years for a flexible sigmoidoscopy or every 10 years for a colonoscopy beginning at age 28 and continuing until age  1.  Hepatitis C blood test.** / For all people born from [birth year] through 1965 and any individual with known risks for hepatitis C.  Skin self-exam. / Monthly.  Influenza vaccine. / Every  year.  Tetanus, diphtheria, and acellular pertussis (Tdap/Td) vaccine.** / Consult your health care provider. 1 dose of Td every 10 years.  Varicella vaccine.** / Consult your health care provider.  Zoster vaccine.** / 1 dose for adults aged 53 years or older.  Measles, mumps, rubella (MMR) vaccine.** / You need at least 1 dose of MMR if you were born in 1957 or later. You may also need a second dose.  Pneumococcal 13-valent conjugate (PCV13) vaccine.** / Consult your health care provider.  Pneumococcal polysaccharide (PPSV23) vaccine.** / 1 to 2 doses if you smoke cigarettes or if you have certain conditions.  Meningococcal vaccine.** / Consult your health care provider.  Hepatitis A vaccine.** / Consult your health care provider.  Hepatitis B vaccine.** / Consult your health care provider.  Haemophilus influenzae type b (Hib) vaccine.** / Consult your health care provider. Ages 77 and over  Blood pressure check.** / Every 1 to 2 years.  Lipid and cholesterol check.**/ Every 5 years beginning at age 85.  Lung cancer screening. / Every year if you are aged 55-80 years and have a 30-pack-year history of smoking and currently smoke or have quit within the past 15 years. Yearly screening is stopped once you have quit smoking for at least 15 years or develop a health problem that would prevent you from having lung cancer treatment.  Fecal occult blood test (FOBT) of stool. / Every year beginning at age 33 and continuing until age 11. You may not have to do this test if you get a colonoscopy every 10 years.  Flexible sigmoidoscopy** or colonoscopy.** / Every 5 years for a flexible sigmoidoscopy or every 10 years for a colonoscopy beginning at age 28 and continuing until age 73.  Hepatitis C blood  test.** / For all people born from [birth year] through 1965 and any individual with known risks for hepatitis C.  Abdominal aortic aneurysm (AAA) screening.** / A one-time screening for ages 50 to 27 years who are current or former smokers.  Skin self-exam. / Monthly.  Influenza vaccine. / Every year.  Tetanus, diphtheria, and acellular pertussis (Tdap/Td) vaccine.** / 1 dose of Td every 10 years.  Varicella vaccine.** / Consult your health care provider.  Zoster vaccine.** / 1 dose for adults aged 34 years or older.  Pneumococcal 13-valent conjugate (PCV13) vaccine.** / Consult your health care provider.  Pneumococcal polysaccharide (PPSV23) vaccine.** / 1 dose for all adults aged 63 years and older.  Meningococcal vaccine.** / Consult your health care provider.  Hepatitis A vaccine.** / Consult your health care provider.  Hepatitis B vaccine.** / Consult your health care provider.  Haemophilus influenzae type b (Hib) vaccine.** / Consult your health care provider. **Family history and personal history of risk and conditions may change your health care provider's recommendations. Document Released: 02/04/2002 Document Revised: 12/14/2013 Document Reviewed: 05/06/2011 [facility] Patient Information 2015 [place], [place]. This information is not intended to replace advice given to you by your health care provider. Make sure you discuss any questions you have with your health care provider.

## 2015-09-26 NOTE — Progress Notes (Signed)
Pre visit review using our clinic review tool, if applicable. No additional management support is needed unless otherwise documented below in the visit note. 

## 2015-10-01 ENCOUNTER — Encounter: Payer: Self-pay | Admitting: Family Medicine

## 2015-10-01 DIAGNOSIS — Z0000 Encounter for general adult medical examination without abnormal findings: Secondary | ICD-10-CM | POA: Insufficient documentation

## 2015-10-01 HISTORY — DX: Encounter for general adult medical examination without abnormal findings: Z00.00

## 2015-10-01 NOTE — Assessment & Plan Note (Signed)
Patient denies any difficulties at home. No trouble with ADLs, depression or falls. No recent changes to vision or hearing. Is UTD with immunizations. Is UTD with screening. Discussed Advanced Directives, patient agrees to bring [place] copies of documents if can. Encouraged heart healthy diet, exercise as tolerated and adequate sleep. Labs reviewed Follows with [facility], Dr Baldo Ash of neurology Follows with [facility], Dr Isabel Caprice They decline further colonoscopies for screening purposes See problem list for risk factors.  See AVS for possible health maintenance screening moving forward.

## 2015-10-01 NOTE — Assessment & Plan Note (Signed)
[id number] acceptable, minimize simple carbs. Increase exercise as tolerated.  

## 2015-10-01 NOTE — Assessment & Plan Note (Signed)
He is accompanied by his wife and they report he is doing well at home with her

## 2015-10-01 NOTE — Assessment & Plan Note (Signed)
Tolerating statin, encouraged heart healthy diet, avoid trans fats, minimize simple carbs and saturated fats. Increase exercise as tolerated 

## 2015-11-18 ENCOUNTER — Other Ambulatory Visit: Payer: Self-pay | Admitting: Diagnostic Neuroimaging

## 2015-12-16 ENCOUNTER — Other Ambulatory Visit: Payer: Self-pay | Admitting: Diagnostic Neuroimaging

## 2016-01-14 ENCOUNTER — Other Ambulatory Visit: Payer: Self-pay | Admitting: Diagnostic Neuroimaging

## 2016-01-15 ENCOUNTER — Other Ambulatory Visit: Payer: Self-pay

## 2016-01-16 ENCOUNTER — Other Ambulatory Visit: Payer: Self-pay | Admitting: Diagnostic Neuroimaging

## 2016-01-16 NOTE — Telephone Encounter (Signed)
LVM informing patient and wife that refill cannot be completed until patient has FU with Dr Marjory Lies scheduled. Left this caller's name, number for call back.

## 2016-01-17 NOTE — Telephone Encounter (Signed)
Pharmacy called reg refill. msg relayed pt would need to be seen by provider before RX can be filled

## 2016-01-24 NOTE — Telephone Encounter (Signed)
LVM requesting wife call back and schedule FU with Dr Marjory Lies as pt has not been seen since 10/2014. Left this caller' name, number.

## 2016-01-24 NOTE — Telephone Encounter (Signed)
Wife called to advise, husband sees Dr. Jacalyn Lefevre now (a Dr. Claiborne Billings Dr. Marjory Lies referred patient to)  and Dr. Zola Button is taking care of all prescription refills.

## 2016-01-30 ENCOUNTER — Telehealth: Payer: Self-pay | Admitting: Family Medicine

## 2016-01-30 NOTE — Telephone Encounter (Signed)
LM for pt to call and schedule flu shot or update records. °

## 2016-01-31 NOTE — Telephone Encounter (Signed)
Flu shot record.//AB/CMA 

## 2016-01-31 NOTE — Telephone Encounter (Signed)
Flu shot received 10/06/15 at [facility]

## 2016-03-28 ENCOUNTER — Encounter: Payer: Self-pay | Admitting: Family Medicine

## 2016-03-28 ENCOUNTER — Ambulatory Visit (INDEPENDENT_AMBULATORY_CARE_PROVIDER_SITE_OTHER): Payer: Medicare Other | Admitting: Family Medicine

## 2016-03-28 VITALS — BP 120/78 | HR 81 | Temp 97.7°F | Ht 70.0 in | Wt 146.1 lb

## 2016-03-28 DIAGNOSIS — J300 Vasomotor rhinitis: Secondary | ICD-10-CM

## 2016-03-28 DIAGNOSIS — K279 Peptic ulcer, site unspecified, unspecified as acute or chronic, without hemorrhage or perforation: Secondary | ICD-10-CM

## 2016-03-28 DIAGNOSIS — J418 Mixed simple and mucopurulent chronic bronchitis: Secondary | ICD-10-CM

## 2016-03-28 DIAGNOSIS — R739 Hyperglycemia, unspecified: Secondary | ICD-10-CM | POA: Diagnosis not present

## 2016-03-28 DIAGNOSIS — E782 Mixed hyperlipidemia: Secondary | ICD-10-CM

## 2016-03-28 LAB — COMPREHENSIVE METABOLIC PANEL
ALK PHOS: 104 U/L (ref 39–117)
ALT: 5 U/L (ref 0–53)
AST: 22 U/L (ref 0–37)
Albumin: 4 g/dL (ref 3.5–5.2)
BILIRUBIN TOTAL: 0.5 mg/dL (ref 0.2–1.2)
BUN: 20 mg/dL (ref 6–23)
CALCIUM: 9 mg/dL (ref 8.4–10.5)
CO2: 31 meq/L (ref 19–32)
Chloride: 103 mEq/L (ref 96–112)
Creatinine, Ser: 0.76 mg/dL (ref 0.40–1.50)
GFR: 106.13 mL/min (ref >60.00)
Glucose, Bld: 92 mg/dL (ref 70–99)
Potassium: 4.3 mEq/L (ref 3.5–5.1)
Sodium: 138 mEq/L (ref 135–145)
TOTAL PROTEIN: 6.6 g/dL (ref 6.0–8.3)

## 2016-03-28 LAB — CBC
HCT: 46.1 % (ref 39.0–52.0)
HEMOGLOBIN: 15.3 g/dL (ref 13.0–17.0)
MCHC: 33.3 g/dL (ref 30.0–36.0)
MCV: 93.4 fl (ref 78.0–100.0)
PLATELETS: 200 10*3/uL (ref 150.0–400.0)
RBC: 4.93 Mil/uL (ref 4.22–5.81)
RDW: 13.9 % (ref 11.5–15.5)
WBC: 6.5 10*3/uL (ref 4.0–10.5)

## 2016-03-28 LAB — LIPID PANEL
Cholesterol: 144 mg/dL (ref 0–200)
HDL: 60.5 mg/dL (ref >39.00)
LDL Cholesterol: 73 mg/dL (ref 0–99)
NONHDL: 83.53
Total CHOL/HDL Ratio: 2
Triglycerides: 53 mg/dL (ref 0.0–149.0)
VLDL: 10.6 mg/dL (ref 0.0–40.0)

## 2016-03-28 LAB — TSH: TSH: 1.89 u[IU]/mL (ref 0.35–4.50)

## 2016-03-28 LAB — HEMOGLOBIN A1C: HEMOGLOBIN A1C: 5.7 % (ref 4.6–6.5)

## 2016-03-28 MED ORDER — AZELASTINE HCL 0.1 % NA SOLN: 2.0000 | Freq: Two times a day (BID) | NASAL | Status: DC | PRN

## 2016-03-28 NOTE — Patient Instructions (Addendum)
Take good deep breaths a couple times a day   Cholesterol Cholesterol is a white, waxy, fat-like substance needed by your body in small amounts. The liver makes all the cholesterol you need. Cholesterol is carried from the liver by the blood through the blood vessels. Deposits of cholesterol (plaque) may build up on blood vessel walls. These make the arteries narrower and stiffer. Cholesterol plaques increase the risk for heart attack and stroke.  You cannot feel your cholesterol level even if it is very high. The only way to know it is high is with a blood test. Once you know your cholesterol levels, you should keep a record of the test results. Work with your health care provider to keep your levels in the desired range.  WHAT DO THE RESULTS MEAN?  Total cholesterol is a rough measure of all the cholesterol in your blood.   LDL is the so-called bad cholesterol. This is the type that deposits cholesterol in the walls of the arteries. You want this level to be low.   HDL is the good cholesterol because it cleans the arteries and carries the LDL away. You want this level to be high.  Triglycerides are fat that the body can either burn for energy or store. High levels are closely linked to heart disease.  WHAT ARE THE DESIRED LEVELS OF CHOLESTEROL?  Total cholesterol below 200.   LDL below 100 for people at risk, below 70 for those at very high risk.   HDL above 50 is good, above 60 is best.   Triglycerides below 150.  HOW CAN I LOWER MY CHOLESTEROL?  Diet. Follow your diet programs as directed by your health care provider.   Choose fish or white meat chicken and [place], roasted or baked. Limit fatty cuts of red meat, fried foods, and processed meats, such as sausage and lunch meats.   Eat lots of fresh fruits and vegetables.  Choose whole grains, beans, pasta, potatoes, and cereals.   Use only small amounts of olive, corn, or canola oils.   Avoid butter, mayonnaise,  shortening, or palm kernel oils.  Avoid foods with trans fats.   Drink skim or nonfat milk and eat low-fat or nonfat yogurt and cheeses. Avoid whole milk, cream, ice cream, egg yolks, and full-fat cheeses.   Healthy desserts include angel food cake, ginger snaps, animal crackers, hard candy, popsicles, and low-fat or nonfat frozen yogurt. Avoid pastries, cakes, pies, and cookies.   Exercise. Follow your exercise programs as directed by your health care provider.   A regular program helps decrease LDL and raise HDL.   A regular program helps with weight control.   Do things that increase your activity level like gardening, walking, or taking the stairs. Ask your health care provider about how you can be more active in your daily life.   Medicine. Take medicine only as directed by your health care provider.   Medicine may be prescribed by your health care provider to help lower cholesterol and decrease the risk for heart disease.   If you have several risk factors, you may need medicine even if your levels are normal.   This information is not intended to replace advice given to you by your health care provider. Make sure you discuss any questions you have with your health care provider.   Document Released: 09/03/2001 Document Revised: 12/30/2014 Document Reviewed: 09/22/2013 Elsevier Interactive Patient Education [employer]2016 Elsevier Inc.

## 2016-03-28 NOTE — Progress Notes (Signed)
Pre visit review using our clinic review tool, if applicable. No additional management support is needed unless otherwise documented below in the visit note. 

## 2016-04-04 LAB — COLOGUARD: Cologuard: NEGATIVE

## 2016-04-14 NOTE — Assessment & Plan Note (Signed)
minimize simple carbs. Increase exercise as tolerated.  

## 2016-04-14 NOTE — Assessment & Plan Note (Signed)
No recent exacerbation, continue current meds

## 2016-04-14 NOTE — Progress Notes (Signed)
Patient ID: Brandon Young., male   DOB: 1941-07-12, 75 y.o.   MRN: [id number]   Subjective:    Patient ID: Brandon Young., male    DOB: Jan 07, 1941, 75 y.o.   MRN: [id number]  Chief Complaint  Patient presents with  . Follow-up    HPI Patient is in today for follow up. Is feeling fairly well. No recent illness but he is frustrated with a frequent runny nose. Always clear and worse after exercise and with hot liquids. Denies CP/palp/SOB/HA/fevers/GI or GU c/o. Taking meds as prescribed  Past Medical History  Diagnosis Date  . Asthma     childhood- age 82  . History of chicken pox     childhood  . Ulcer of abdomen wall (HCC) 1988  . History of blood transfusion 1988  . Acute bronchitis 04/14/2013  . Dementia   . COPD (chronic obstructive pulmonary disease) (HCC) 05/02/2013  . Elevated PSA 08/22/2013  . Other and unspecified hyperlipidemia 08/22/2013  . 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 Medicare annual wellness visit, subsequent 10/01/2015    Past Surgical History  Procedure Laterality Date  . Abdominal surgery  1988    bleed ulcer  . Tonsillectomy    . Hernia repair  2013    Family History  Problem Relation Age of Onset  . Colon cancer Neg Hx   . Heart disease Neg Hx   . Prostate cancer Neg Hx   . Breast cancer Neg Hx   . Diabetes Neg Hx   . Hypertension Mother   . Dementia Mother   . Other Father     GI infection/disturbance  . Tremor Father   . COPD Sister     smoker  . Tremor Sister   . Tremor Brother   . Tremor Sister     Social History   Social History  . Marital Status: Married    Spouse Name: martha  . Number of Children: 2  . Years of Education: MED   Occupational History  . retired    Social History Main Topics  . Smoking status: Former Games developer  . Smokeless tobacco: Never Used     Comment: 1 ppd for 30 years  . Alcohol Use: Yes     Comment: wine  . Drug Use: No  . Sexual Activity: Yes     Comment: lives with wife, no major dietary  restrictions    Other Topics Concern  . Not on file   Social History Narrative   Patient lives at home with spouse.   Caffeine Use: 2 cups daily    Outpatient Prescriptions Prior to Visit  Medication Sig Dispense Refill  . albuterol (PROVENTIL HFA;VENTOLIN HFA) 108 (90 BASE) MCG/ACT inhaler Inhale 2 puffs into the lungs every 6 (six) hours as needed for wheezing or shortness of breath. 1 Inhaler 5  . aspirin 81 MG tablet Take 81 mg by mouth daily.    Marland Kitchen atorvastatin (LIPITOR) 10 MG tablet TAKE 1 TABLET BY MOUTH ONCE DAILY 90 tablet 2  . carbidopa-levodopa (SINEMET IR) 25-100 MG per tablet Take 2 tablets by mouth 3 (three) times daily.    Marland Kitchen CIALIS 20 MG tablet TAKE 1 TABLET BY MOUTH EVERY 36 HOURS AS NEEDED 5 tablet 3  . clonazePAM (KLONOPIN) 0.5 MG tablet Take 1 tablet (0.5 mg total) by mouth daily as needed for anxiety. 10 tablet 3  . donepezil (ARICEPT) 10 MG tablet TAKE 1 TABLET BY MOUTH AT BEDTIME 30 tablet 0  . escitalopram (  LEXAPRO) 10 MG tablet TAKE 1 TABLET EVERY DAY 90 tablet 2  . ipratropium (ATROVENT) 0.03 % nasal spray     . ondansetron (ZOFRAN-ODT) 4 MG disintegrating tablet Take 1 tablet (4 mg total) by mouth every 8 (eight) hours as needed for nausea. 30 tablet 3  . primidone (MYSOLINE) 50 MG tablet Tapering scale     No facility-administered medications prior to visit.    No Known Allergies  Review of Systems  Constitutional: Negative for fever and malaise/fatigue.  HENT: Positive for congestion.   Eyes: Negative for blurred vision.  Respiratory: Negative for shortness of breath.   Cardiovascular: Negative for chest pain, palpitations and leg swelling.  Gastrointestinal: Negative for nausea, abdominal pain and blood in stool.  Genitourinary: Negative for dysuria and frequency.  Musculoskeletal: Negative for falls.  Skin: Negative for rash.  Neurological: Negative for dizziness, loss of consciousness and headaches.  Endo/Heme/Allergies: Negative for environmental  allergies.  Psychiatric/Behavioral: Negative for depression. The patient is not nervous/anxious.        Objective:    Physical Exam  Constitutional: He is oriented to person, place, and time. He appears well-developed and well-nourished. No distress.  HENT:  Head: Normocephalic and atraumatic.  Nose: Nose normal.  Eyes: Right eye exhibits no discharge. Left eye exhibits no discharge.  Neck: Normal range of motion. Neck supple.  Cardiovascular: Normal rate and regular rhythm.   Pulmonary/Chest: Effort normal and breath sounds normal.  Abdominal: Soft. Bowel sounds are normal. There is no tenderness.  Genitourinary: Penis normal.  Musculoskeletal: He exhibits no edema.  Neurological: He is alert and oriented to person, place, and time.  Skin: Skin is warm and dry.  Psychiatric: He has a normal mood and affect.  Nursing note and vitals reviewed.   BP 120/78 mmHg  Pulse 81  Temp(Src) 97.7 F (36.5 C) (Oral)  Ht  (1.778 m)  Wt 146 lb 2 oz (66.282 kg)  BMI 20.97 kg/m2  SpO2 95% Wt Readings from Last 3 Encounters:  03/28/16 146 lb 2 oz (66.282 kg)  09/26/15 144 lb 8 oz (65.545 kg)  03/27/15 146 lb (66.225 kg)     Lab Results  Component Value Date   WBC 6.5 03/28/2016   HGB 15.3 03/28/2016   HCT 46.1 03/28/2016   PLT 200.0 03/28/2016   GLUCOSE 92 03/28/2016   CHOL 144 03/28/2016   TRIG 53.0 03/28/2016   HDL 60.50 03/28/2016   LDLCALC 73 03/28/2016   ALT 5 03/28/2016   AST 22 03/28/2016   NA 138 03/28/2016   K 4.3 03/28/2016   CL 103 03/28/2016   CREATININE 0.76 03/28/2016   BUN 20 03/28/2016   CO2 31 03/28/2016   TSH 1.89 03/28/2016   PSA 4.14* 04/30/2013   HGBA1C 5.7 03/28/2016   MICROALBUR <0.7 09/26/2015    Lab Results  Component Value Date   TSH 1.89 03/28/2016   Lab Results  Component Value Date   WBC 6.5 03/28/2016   HGB 15.3 03/28/2016   HCT 46.1 03/28/2016   MCV 93.4 03/28/2016   PLT 200.0 03/28/2016   Lab Results  Component Value  Date   NA 138 03/28/2016   K 4.3 03/28/2016   CO2 31 03/28/2016   GLUCOSE 92 03/28/2016   BUN 20 03/28/2016   CREATININE 0.76 03/28/2016   BILITOT 0.5 03/28/2016   ALKPHOS 104 03/28/2016   AST 22 03/28/2016   ALT 5 03/28/2016   PROT 6.6 03/28/2016   ALBUMIN 4.0 03/28/2016  CALCIUM 9.0 03/28/2016   GFR 106.13 03/28/2016   Lab Results  Component Value Date   CHOL 144 03/28/2016   Lab Results  Component Value Date   HDL 60.50 03/28/2016   Lab Results  Component Value Date   LDLCALC 73 03/28/2016   Lab Results  Component Value Date   TRIG 53.0 03/28/2016   Lab Results  Component Value Date   CHOLHDL 2 03/28/2016   Lab Results  Component Value Date   HGBA1C 5.7 03/28/2016       Assessment & Plan:   Problem List Items Addressed This Visit    COPD (chronic obstructive pulmonary disease) (HCC)    No recent exacerbation, continue current meds      Relevant Medications   azelastine (ASTELIN) 0.1 % nasal spray   Other Relevant Orders   TSH (Completed)   CBC (Completed)   Comprehensive metabolic panel (Completed)   Lipid panel (Completed)   Hemoglobin A1c (Completed)   Hyperglycemia     minimize simple carbs. Increase exercise as tolerated.      Relevant Orders   TSH (Completed)   CBC (Completed)   Comprehensive metabolic panel (Completed)   Lipid panel (Completed)   Hemoglobin A1c (Completed)   Hyperlipidemia, mixed - Primary    Tolerating statin, encouraged heart healthy diet, avoid trans fats, minimize simple carbs and saturated fats. Increase exercise as tolerated      Relevant Orders   TSH (Completed)   CBC (Completed)   Comprehensive metabolic panel (Completed)   Lipid panel (Completed)   Hemoglobin A1c (Completed)   PUD (peptic ulcer disease)   Relevant Orders   TSH (Completed)   CBC (Completed)   Comprehensive metabolic panel (Completed)   Lipid panel (Completed)   Hemoglobin A1c (Completed)   Vasomotor rhinitis    Very frustrated,  encouraged to try Astelin         I am having Mr. Rachel start on azelastine. I am also having him maintain his aspirin, CIALIS, albuterol, ipratropium, clonazePAM, ondansetron, escitalopram, primidone, carbidopa-levodopa, atorvastatin, and donepezil.  Meds ordered this encounter  Medications  . azelastine (ASTELIN) 0.1 % nasal spray    Sig: Place 2 sprays into both nostrils 2 (two) times daily as needed for rhinitis. Use in each nostril as directed    Dispense:  30 mL    Refill:  12     Danise EdgeBLYTH, Shanyah Gattuso, MD

## 2016-04-14 NOTE — Assessment & Plan Note (Signed)
Very frustrated, encouraged to try Astelin

## 2016-04-14 NOTE — Assessment & Plan Note (Signed)
Tolerating statin, encouraged heart healthy diet, avoid trans fats, minimize simple carbs and saturated fats. Increase exercise as tolerated 

## 2016-04-16 ENCOUNTER — Telehealth: Payer: Self-pay | Admitting: Family Medicine

## 2016-04-16 ENCOUNTER — Encounter: Payer: Self-pay | Admitting: Family Medicine

## 2016-04-16 NOTE — Telephone Encounter (Signed)
Received Cologuard results---Negative Abstracted into chart  Mailed a copy to the patient. Sent original to scan.

## 2016-05-15 ENCOUNTER — Other Ambulatory Visit: Payer: Self-pay | Admitting: Family Medicine

## 2016-10-10 ENCOUNTER — Encounter: Payer: Self-pay | Admitting: Family Medicine

## 2016-10-10 ENCOUNTER — Ambulatory Visit (INDEPENDENT_AMBULATORY_CARE_PROVIDER_SITE_OTHER): Payer: Medicare Other | Admitting: Family Medicine

## 2016-10-10 VITALS — BP 90/63 | HR 60 | Temp 97.6°F | Resp 16 | Ht 69.5 in | Wt 148.4 lb

## 2016-10-10 DIAGNOSIS — Z0000 Encounter for general adult medical examination without abnormal findings: Secondary | ICD-10-CM

## 2016-10-10 DIAGNOSIS — E782 Mixed hyperlipidemia: Secondary | ICD-10-CM | POA: Diagnosis not present

## 2016-10-10 DIAGNOSIS — R634 Abnormal weight loss: Secondary | ICD-10-CM | POA: Diagnosis not present

## 2016-10-10 DIAGNOSIS — R739 Hyperglycemia, unspecified: Secondary | ICD-10-CM | POA: Diagnosis not present

## 2016-10-10 DIAGNOSIS — J418 Mixed simple and mucopurulent chronic bronchitis: Secondary | ICD-10-CM | POA: Diagnosis not present

## 2016-10-10 NOTE — Progress Notes (Signed)
Pre visit review using our clinic review tool, if applicable. No additional management support is needed unless otherwise documented below in the visit note. 

## 2016-10-10 NOTE — Patient Instructions (Signed)
60-64 oz of clear fluids daily (caffeine works against [place])   Preventive Care for Adults, Male A healthy lifestyle and preventive care can promote health and wellness. Preventive health guidelines for men include the following key practices:  A routine yearly physical is a good way to check with your health care provider about your health and preventative screening. It is a chance to share any concerns and updates on your health and to receive a thorough exam.  Visit your dentist for a routine exam and preventative care every 6 months. Brush your teeth twice a day and floss once a day. Good oral hygiene prevents tooth decay and gum disease.  The frequency of eye exams is based on your age, health, family medical history, use of contact lenses, and other factors. Follow your health care provider's recommendations for frequency of eye exams.  Eat a healthy diet. Foods such as vegetables, fruits, whole grains, low-fat dairy products, and lean protein foods contain the nutrients you need without too many calories. Decrease your intake of foods high in solid fats, added sugars, and salt. Eat the right amount of calories for you.Get information about a proper diet from your health care provider, if necessary.  Regular physical exercise is one of the most important things you can do for your health. Most adults should get at least 150 minutes of moderate-intensity exercise (any activity that increases your heart rate and causes you to sweat) each week. In addition, most adults need muscle-strengthening exercises on 2 or more days a week.  Maintain a healthy weight. The body mass index (BMI) is a screening tool to identify possible weight problems. It provides an estimate of body fat based on height and weight. Your health care provider can find your BMI and can help you achieve or maintain a healthy weight.For adults 20 years and older:  A BMI below 18.5 is considered underweight.  A BMI of 18.5 to 24.9  is normal.  A BMI of 25 to 29.9 is considered overweight.  A BMI of 30 and above is considered obese.  Maintain normal blood lipids and cholesterol levels by exercising and minimizing your intake of saturated fat. Eat a balanced diet with plenty of fruit and vegetables. Blood tests for lipids and cholesterol should begin at age 47 and be repeated every 5 years. If your lipid or cholesterol levels are high, you are over 50, or you are at high risk for heart disease, you may need your cholesterol levels checked more frequently.Ongoing high lipid and cholesterol levels should be treated with medicines if diet and exercise are not working.  If you smoke, find out from your health care provider how to quit. If you do not use tobacco, do not start.  Lung cancer screening is recommended for adults aged 47-80 years who are at high risk for developing lung cancer because of a history of smoking. A yearly low-dose CT scan of the lungs is recommended for people who have at least a 30-pack-year history of smoking and are a current smoker or have quit within the past 15 years. A pack year of smoking is smoking an average of 1 pack of cigarettes a day for 1 year (for example: 1 pack a day for 30 years or 2 packs a day for 15 years). Yearly screening should continue until the smoker has stopped smoking for at least 15 years. Yearly screening should be stopped for people who develop a health problem that would prevent them from having lung cancer  treatment.  If you choose to drink alcohol, do not have more than 2 drinks per day. One drink is considered to be 12 ounces (355 mL) of beer, 5 ounces (148 mL) of wine, or 1.5 ounces (44 mL) of liquor.  Avoid use of street drugs. Do not share needles with anyone. Ask for help if you need support or instructions about stopping the use of drugs.  High blood pressure causes heart disease and increases the risk of stroke. Your blood pressure should be checked at least every  1-2 years. Ongoing high blood pressure should be treated with medicines, if weight loss and exercise are not effective.  If you are 33-19 years old, ask your health care provider if you should take aspirin to prevent heart disease.  Diabetes screening is done by taking a blood sample to check your blood glucose level after you have not eaten for a certain period of time (fasting). If you are not overweight and you do not have risk factors for diabetes, you should be screened once every 3 years starting at age 7. If you are overweight or obese and you are 65-38 years of age, you should be screened for diabetes every year as part of your cardiovascular risk assessment.  Colorectal cancer can be detected and often prevented. Most routine colorectal cancer screening begins at the age of 22 and continues through age 52. However, your health care provider may recommend screening at an earlier age if you have risk factors for colon cancer. On a yearly basis, your health care provider may provide home test kits to check for hidden blood in the stool. Use of a small camera at the end of a tube to directly examine the colon (sigmoidoscopy or colonoscopy) can detect the earliest forms of colorectal cancer. Talk to your health care provider about this at age 72, when routine screening begins. Direct exam of the colon should be repeated every 5-10 years through age 52, unless early forms of precancerous polyps or small growths are found.  People who are at an increased risk for hepatitis B should be screened for this virus. You are considered at high risk for hepatitis B if:  You were born in a country where hepatitis B occurs often. Talk with your health care provider about which countries are considered high risk.  Your parents were born in a high-risk country and you have not received a shot to protect against hepatitis B (hepatitis B vaccine).  You have HIV or AIDS.  You use needles to inject street  drugs.  You live with, or have sex with, someone who has hepatitis B.  You are a man who has sex with other men (MSM).  You get hemodialysis treatment.  You take certain medicines for conditions such as cancer, organ transplantation, and autoimmune conditions.  Hepatitis C blood testing is recommended for all people born from [birth year] through 1965 and any individual with known risks for hepatitis C.  Practice safe sex. Use condoms and avoid high-risk sexual practices to reduce the spread of sexually transmitted infections (STIs). STIs include gonorrhea, chlamydia, syphilis, trichomonas, herpes, HPV, and human immunodeficiency virus (HIV). Herpes, HIV, and HPV are viral illnesses that have no cure. They can result in disability, cancer, and death.  If you are a man who has sex with other men, you should be screened at least once per year for:  HIV.  Urethral, rectal, and pharyngeal infection of gonorrhea, chlamydia, or both.  If you are at risk  of being infected with HIV, it is recommended that you take a prescription medicine daily to prevent HIV infection. This is called preexposure prophylaxis (PrEP). You are considered at risk if:  You are a man who has sex with other men (MSM) and have other risk factors.  You are a heterosexual man, are sexually active, and are at increased risk for HIV infection.  You take drugs by injection.  You are sexually active with a partner who has HIV.  Talk with your health care provider about whether you are at high risk of being infected with HIV. If you choose to begin PrEP, you should first be tested for HIV. You should then be tested every 3 months for as long as you are taking PrEP.  A one-time screening for abdominal aortic aneurysm (AAA) and surgical repair of large AAAs by ultrasound are recommended for men ages 65 to 63 years who are current or former smokers.  Healthy men should no longer receive prostate-specific antigen (PSA) blood tests as  part of routine cancer screening. Talk with your health care provider about prostate cancer screening.  Testicular cancer screening is not recommended for adult males who have no symptoms. Screening includes self-exam, a health care provider exam, and other screening tests. Consult with your health care provider about any symptoms you have or any concerns you have about testicular cancer.  Use sunscreen. Apply sunscreen liberally and repeatedly throughout the day. You should seek shade when your shadow is shorter than you. Protect yourself by wearing long sleeves, pants, a wide-brimmed hat, and sunglasses year round, whenever you are outdoors.  Once a month, do a whole-body skin exam, using a mirror to look at the skin on your back. Tell your health care provider about new moles, moles that have irregular borders, moles that are larger than a pencil eraser, or moles that have changed in shape or color.  Stay current with required vaccines (immunizations).  Influenza vaccine. All adults should be immunized every year.  Tetanus, diphtheria, and acellular pertussis (Td, Tdap) vaccine. An adult who has not previously received Tdap or who does not know his vaccine status should receive 1 dose of Tdap. This initial dose should be followed by tetanus and diphtheria toxoids (Td) booster doses every 10 years. Adults with an unknown or incomplete history of completing a 3-dose immunization series with Td-containing vaccines should begin or complete a primary immunization series including a Tdap dose. Adults should receive a Td booster every 10 years.  Varicella vaccine. An adult without evidence of immunity to varicella should receive 2 doses or a second dose if he has previously received 1 dose.  Human papillomavirus (HPV) vaccine. Males aged 11-21 years who have not received the vaccine previously should receive the 3-dose series. Males aged 22-26 years may be immunized. Immunization is recommended through  the age of 82 years for any male who has sex with males and did not get any or all doses earlier. Immunization is recommended for any person with an immunocompromised condition through the age of 69 years if he did not get any or all doses earlier. During the 3-dose series, the second dose should be obtained 4-8 weeks after the first dose. The third dose should be obtained 24 weeks after the first dose and 16 weeks after the second dose.  Zoster vaccine. One dose is recommended for adults aged 9 years or older unless certain conditions are present.  Measles, mumps, and rubella (MMR) vaccine. Adults born before [birth year]  generally are considered immune to measles and mumps. Adults born in [birth year] or later should have 1 or more doses of MMR vaccine unless there is a contraindication to the vaccine or there is laboratory evidence of immunity to each of the three diseases. A routine second dose of MMR vaccine should be obtained at least 28 days after the first dose for students attending postsecondary schools, health care workers, or international travelers. People who received inactivated measles vaccine or an unknown type of measles vaccine during 1963-1967 should receive 2 doses of MMR vaccine. People who received inactivated mumps vaccine or an unknown type of mumps vaccine before 1979 and are at high risk for mumps infection should consider immunization with 2 doses of MMR vaccine. Unvaccinated health care workers born before [birth year] who lack laboratory evidence of measles, mumps, or rubella immunity or laboratory confirmation of disease should consider measles and mumps immunization with 2 doses of MMR vaccine or rubella immunization with 1 dose of MMR vaccine.  Pneumococcal 13-valent conjugate (PCV13) vaccine. When indicated, a person who is uncertain of his immunization history and has no record of immunization should receive the PCV13 vaccine. All adults 67 years of age and older should receive this vaccine. An  adult aged 71 years or older who has certain medical conditions and has not been previously immunized should receive 1 dose of PCV13 vaccine. This PCV13 should be followed with a dose of pneumococcal polysaccharide (PPSV23) vaccine. Adults who are at high risk for pneumococcal disease should obtain the PPSV23 vaccine at least 8 weeks after the dose of PCV13 vaccine. Adults older than 76 years of age who have normal immune system function should obtain the PPSV23 vaccine dose at least 1 year after the dose of PCV13 vaccine.  Pneumococcal polysaccharide (PPSV23) vaccine. When PCV13 is also indicated, PCV13 should be obtained first. All adults aged 52 years and older should be immunized. An adult younger than age 60 years who has certain medical conditions should be immunized. Any person who resides in a nursing home or long-term care facility should be immunized. An adult smoker should be immunized. People with an immunocompromised condition and certain other conditions should receive both PCV13 and PPSV23 vaccines. People with human immunodeficiency virus (HIV) infection should be immunized as soon as possible after diagnosis. Immunization during chemotherapy or radiation therapy should be avoided. Routine use of PPSV23 vaccine is not recommended for American Indians, [place]atives, or people younger than 65 years unless there are medical conditions that require PPSV23 vaccine. When indicated, people who have unknown immunization and have no record of immunization should receive PPSV23 vaccine. One-time revaccination 5 years after the first dose of PPSV23 is recommended for people aged 19-64 years who have chronic kidney failure, nephrotic syndrome, asplenia, or immunocompromised conditions. People who received 1-2 doses of PPSV23 before age 67 years should receive another dose of PPSV23 vaccine at age 46 years or later if at least 5 years have passed since the previous dose. Doses of PPSV23 are not needed for  people immunized with PPSV23 at or after age 44 years.  Meningococcal vaccine. Adults with asplenia or persistent complement component deficiencies should receive 2 doses of quadrivalent meningococcal conjugate (MenACWY-D) vaccine. The doses should be obtained at least 2 months apart. Microbiologists working with certain meningococcal bacteria, [place] recruits, people at risk during an outbreak, and people who travel to or live in countries with a high rate of meningitis should be immunized. A first-year college student up through age  21 years who is living in a residence hall should receive a dose if he did not receive a dose on or after his 16th birthday. Adults who have certain high-risk conditions should receive one or more doses of vaccine.  Hepatitis A vaccine. Adults who wish to be protected from this disease, have chronic liver disease, work with hepatitis A-infected animals, work in hepatitis A research labs, or travel to or work in countries with a high rate of hepatitis A should be immunized. Adults who were previously unvaccinated and who anticipate close contact with an international adoptee during the first 60 days after arrival in the [place] from a country with a high rate of hepatitis A should be immunized.  Hepatitis B vaccine. Adults should be immunized if they wish to be protected from this disease, are under age 26 years and have diabetes, have chronic liver disease, have had more than one sex partner in the past 6 months, may be exposed to blood or other infectious body fluids, are household contacts or sex partners of hepatitis B positive people, are clients or workers in certain care facilities, or travel to or work in countries with a high rate of hepatitis B.  Haemophilus influenzae type b (Hib) vaccine. A previously unvaccinated person with asplenia or sickle cell disease or having a scheduled splenectomy should receive 1 dose of Hib vaccine. Regardless of previous  immunization, a recipient of a hematopoietic stem cell transplant should receive a 3-dose series 6-12 months after his successful transplant. Hib vaccine is not recommended for adults with HIV infection. Preventive Service / Frequency Ages 17 to 64  Blood pressure check.** / Every 3-5 years.  Lipid and cholesterol check.** / Every 5 years beginning at age 91.  Hepatitis C blood test.** / For any individual with known risks for hepatitis C.  Skin self-exam. / Monthly.  Influenza vaccine. / Every year.  Tetanus, diphtheria, and acellular pertussis (Tdap, Td) vaccine.** / Consult your health care provider. 1 dose of Td every 10 years.  Varicella vaccine.** / Consult your health care provider.  HPV vaccine. / 3 doses over 6 months, if 49 or younger.  Measles, mumps, rubella (MMR) vaccine.** / You need at least 1 dose of MMR if you were born in 1957 or later. You may also need a second dose.  Pneumococcal 13-valent conjugate (PCV13) vaccine.** / Consult your health care provider.  Pneumococcal polysaccharide (PPSV23) vaccine.** / 1 to 2 doses if you smoke cigarettes or if you have certain conditions.  Meningococcal vaccine.** / 1 dose if you are age 41 to 62 years and a Market researcher living in a residence hall, or have one of several medical conditions. You may also need additional booster doses.  Hepatitis A vaccine.** / Consult your health care provider.  Hepatitis B vaccine.** / Consult your health care provider.  Haemophilus influenzae type b (Hib) vaccine.** / Consult your health care provider. Ages 10 to 23  Blood pressure check.** / Every year.  Lipid and cholesterol check.** / Every 5 years beginning at age 30.  Lung cancer screening. / Every year if you are aged 71-80 years and have a 30-pack-year history of smoking and currently smoke or have quit within the past 15 years. Yearly screening is stopped once you have quit smoking for at least 15 years or develop  a health problem that would prevent you from having lung cancer treatment.  Fecal occult blood test (FOBT) of stool. / Every year beginning at age  37 and continuing until age 59. You may not have to do this test if you get a colonoscopy every 10 years.  Flexible sigmoidoscopy** or colonoscopy.** / Every 5 years for a flexible sigmoidoscopy or every 10 years for a colonoscopy beginning at age 60 and continuing until age 25.  Hepatitis C blood test.** / For all people born from [birth year] through 1965 and any individual with known risks for hepatitis C.  Skin self-exam. / Monthly.  Influenza vaccine. / Every year.  Tetanus, diphtheria, and acellular pertussis (Tdap/Td) vaccine.** / Consult your health care provider. 1 dose of Td every 10 years.  Varicella vaccine.** / Consult your health care provider.  Zoster vaccine.** / 1 dose for adults aged 64 years or older.  Measles, mumps, rubella (MMR) vaccine.** / You need at least 1 dose of MMR if you were born in 1958 or later. You may also need a second dose.  Pneumococcal 13-valent conjugate (PCV13) vaccine.** / Consult your health care provider.  Pneumococcal polysaccharide (PPSV23) vaccine.** / 1 to 2 doses if you smoke cigarettes or if you have certain conditions.  Meningococcal vaccine.** / Consult your health care provider.  Hepatitis A vaccine.** / Consult your health care provider.  Hepatitis B vaccine.** / Consult your health care provider.  Haemophilus influenzae type b (Hib) vaccine.** / Consult your health care provider. Ages 53 and over  Blood pressure check.** / Every year.  Lipid and cholesterol check.**/ Every 5 years beginning at age 66.  Lung cancer screening. / Every year if you are aged 46-80 years and have a 30-pack-year history of smoking and currently smoke or have quit within the past 15 years. Yearly screening is stopped once you have quit smoking for at least 15 years or develop a health problem that would prevent  you from having lung cancer treatment.  Fecal occult blood test (FOBT) of stool. / Every year beginning at age 72 and continuing until age 2. You may not have to do this test if you get a colonoscopy every 10 years.  Flexible sigmoidoscopy** or colonoscopy.** / Every 5 years for a flexible sigmoidoscopy or every 10 years for a colonoscopy beginning at age 61 and continuing until age 43.  Hepatitis C blood test.** / For all people born from [birth year] through 1966 and any individual with known risks for hepatitis C.  Abdominal aortic aneurysm (AAA) screening.** / A one-time screening for ages 31 to 37 years who are current or former smokers.  Skin self-exam. / Monthly.  Influenza vaccine. / Every year.  Tetanus, diphtheria, and acellular pertussis (Tdap/Td) vaccine.** / 1 dose of Td every 10 years.  Varicella vaccine.** / Consult your health care provider.  Zoster vaccine.** / 1 dose for adults aged 53 years or older.  Pneumococcal 13-valent conjugate (PCV13) vaccine.** / 1 dose for all adults aged 43 years and older.  Pneumococcal polysaccharide (PPSV23) vaccine.** / 1 dose for all adults aged 64 years and older.  Meningococcal vaccine.** / Consult your health care provider.  Hepatitis A vaccine.** / Consult your health care provider.  Hepatitis B vaccine.** / Consult your health care provider.  Haemophilus influenzae type b (Hib) vaccine.** / Consult your health care provider. **Family history and personal history of risk and conditions may change your health care provider's recommendations.   This information is not intended to replace advice given to you by your health care provider. Make sure you discuss any questions you have with your health care provider.   Document Released:  02/04/2002 Document Revised: 12/30/2014 Document Reviewed: 05/06/2011 [employer]vier Interactive Patient Education [employer].

## 2016-10-11 ENCOUNTER — Encounter: Payer: Self-pay | Admitting: Family Medicine

## 2016-10-11 DIAGNOSIS — Z0000 Encounter for general adult medical examination without abnormal findings: Secondary | ICD-10-CM

## 2016-10-11 HISTORY — DX: Encounter for general adult medical examination without abnormal findings: Z00.00

## 2016-10-11 LAB — CBC
HEMATOCRIT: 46.1 % (ref 39.0–52.0)
Hemoglobin: 15.4 g/dL (ref 13.0–17.0)
MCHC: 33.5 g/dL (ref 30.0–36.0)
MCV: 94.1 fl (ref 78.0–100.0)
PLATELETS: 195 10*3/uL (ref 150.0–400.0)
RBC: 4.9 Mil/uL (ref 4.22–5.81)
RDW: 13.6 % (ref 11.5–15.5)
WBC: 6.7 10*3/uL (ref 4.0–10.5)

## 2016-10-11 LAB — COMPREHENSIVE METABOLIC PANEL
ALBUMIN: 4 g/dL (ref 3.5–5.2)
ALK PHOS: 88 U/L (ref 39–117)
ALT: 12 U/L (ref 0–53)
AST: 21 U/L (ref 0–37)
BUN: 24 mg/dL — ABNORMAL HIGH (ref 6–23)
CALCIUM: 9.1 mg/dL (ref 8.4–10.5)
CHLORIDE: 104 meq/L (ref 96–112)
CO2: 32 mEq/L (ref 19–32)
CREATININE: 0.84 mg/dL (ref 0.40–1.50)
GFR: 94.42 mL/min (ref >60.00)
Glucose, Bld: 79 mg/dL (ref 70–99)
Potassium: 4.5 mEq/L (ref 3.5–5.1)
Sodium: 140 mEq/L (ref 135–145)
Total Bilirubin: 0.4 mg/dL (ref 0.2–1.2)
Total Protein: 6.5 g/dL (ref 6.0–8.3)

## 2016-10-11 LAB — LIPID PANEL
CHOLESTEROL: 144 mg/dL (ref 0–200)
HDL: 58.4 mg/dL (ref >39.00)
LDL CALC: 69 mg/dL (ref 0–99)
NonHDL: 85.77
TRIGLYCERIDES: 83 mg/dL (ref 0.0–149.0)
Total CHOL/HDL Ratio: 2
VLDL: 16.6 mg/dL (ref 0.0–40.0)

## 2016-10-11 LAB — TSH: TSH: 1.68 u[IU]/mL (ref 0.35–4.50)

## 2016-10-11 NOTE — Assessment & Plan Note (Signed)
[id number] acceptable, minimize simple carbs. Increase exercise as tolerated.  

## 2016-10-11 NOTE — Progress Notes (Signed)
Patient ID: Brandon Young., male   DOB: 1941/02/05, 75 y.o.   MRN: [id number]   Subjective:    Patient ID: Brandon Young., male    DOB: 1941-10-07, 75 y.o.   MRN: [id number]  Chief Complaint  Patient presents with  . Annual Exam    not fasting.    HPI Patient is in today for annual preventative physical exam and follow up on numerous medical concerns. He is accompanied by his wife. They report he is doing very well at home at [facility]. His tremor continues but he has not suffered any falls or had any complications from his tremor. He is eating well and drinking well. No recent hospitalizations or acute illnesses. Denies CP/palp/SOB/HA/congestion/fevers/GI or GU c/o. Taking meds as prescribed  Past Medical History:  Diagnosis Date  . Acute bronchitis 04/14/2013  . Asthma    childhood- age 1  . COPD (chronic obstructive pulmonary disease) (HCC) 05/02/2013  . Dementia   . Elevated PSA 08/22/2013  . History of blood transfusion 1988  . History of chicken pox    childhood  . Medicare annual wellness visit, subsequent 10/01/2015  . Other and unspecified hyperlipidemia 08/22/2013  . Preventative health care 10/11/2016  . Ulcer of abdomen wall (HCC) 1988  . 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 ABDOMINAL SURGERY  1988   bleed ulcer  . HERNIA REPAIR  2013  . TONSILLECTOMY      Family History  Problem Relation Age of Onset  . Hypertension Mother   . Dementia Mother   . Other Father     GI infection/disturbance  . Tremor Father   . COPD Sister     smoker  . Tremor Sister   . Tremor Brother   . Tremor Sister   . Colon cancer Neg Hx   . Heart disease Neg Hx   . Prostate cancer Neg Hx   . Breast cancer Neg Hx   . Diabetes Neg Hx     Social History   Social History  . Marital status: Married    Spouse name: martha  . Number of children: 2  . Years of education: MED   Occupational History  . retired     Social History Main Topics  . Smoking status: Former Games developer  . Smokeless tobacco: Never Used     Comment: 1 ppd for 30 years  . Alcohol use Yes     Comment: wine  . Drug use: No  . Sexual activity: Yes     Comment: lives with wife, no major dietary restrictions    Other Topics Concern  . Not on file   Social History Narrative   Patient lives at home with spouse.   Caffeine Use: 2 cups daily    Outpatient Medications Prior to Visit  Medication Sig Dispense Refill  . albuterol (PROVENTIL HFA;VENTOLIN HFA) 108 (90 BASE) MCG/ACT inhaler Inhale 2 puffs into the lungs every 6 (six) hours as needed for wheezing or shortness of breath. 1 Inhaler 5  . aspirin 81 MG tablet Take 81 mg by mouth daily.    Marland Kitchen atorvastatin (LIPITOR) 10 MG tablet TAKE 1 TABLET BY MOUTH ONCE DAILY 90 tablet 2  . azelastine (ASTELIN) 0.1 % nasal spray Place 2 sprays into both nostrils 2 (two) times daily as needed for rhinitis. Use in each nostril as directed 30 mL 12  . carbidopa-levodopa (SINEMET IR) 25-100 MG per tablet Take 2  tablets by mouth 3 (three) times daily.    Marland Kitchen CIALIS 20 MG tablet TAKE 1 TABLET BY MOUTH EVERY 36 HOURS AS NEEDED 5 tablet 3  . clonazePAM (KLONOPIN) 0.5 MG tablet Take 1 tablet (0.5 mg total) by mouth daily as needed for anxiety. 10 tablet 3  . donepezil (ARICEPT) 10 MG tablet TAKE 1 TABLET BY MOUTH AT BEDTIME 30 tablet 0  . escitalopram (LEXAPRO) 10 MG tablet TAKE 1 TABLET EVERY DAY 90 tablet 2  . ondansetron (ZOFRAN-ODT) 4 MG disintegrating tablet Take 1 tablet (4 mg total) by mouth every 8 (eight) hours as needed for nausea. 30 tablet 3  . ipratropium (ATROVENT) 0.03 % nasal spray     . primidone (MYSOLINE) 50 MG tablet Tapering scale     No facility-administered medications prior to visit.     No Known Allergies  Review of Systems  Constitutional: Negative for fever and malaise/fatigue.  HENT: Negative for congestion.   Eyes: Negative for blurred vision.  Respiratory: Negative  for shortness of breath.   Cardiovascular: Negative for chest pain, palpitations and leg swelling.  Gastrointestinal: Negative for abdominal pain, blood in stool and nausea.  Genitourinary: Negative for dysuria and frequency.  Musculoskeletal: Negative for falls.  Skin: Negative for rash.  Neurological: Positive for dizziness and tremors. Negative for loss of consciousness and headaches.  Endo/Heme/Allergies: Negative for environmental allergies.  Psychiatric/Behavioral: Negative for depression. The patient is not nervous/anxious.        Objective:    Physical Exam  Constitutional: He is oriented to person, place, and time. He appears well-developed and well-nourished. No distress.  HENT:  Head: Normocephalic and atraumatic.  Eyes: Conjunctivae are normal.  Neck: Neck supple. No thyromegaly present.  Cardiovascular: Normal rate, regular rhythm and normal heart sounds.   Pulmonary/Chest: Effort normal and breath sounds normal. No respiratory distress. He has no wheezes.  Abdominal: Soft. Bowel sounds are normal. He exhibits no mass. There is no tenderness.  Musculoskeletal: He exhibits no edema.  Lymphadenopathy:    He has no cervical adenopathy.  Neurological: He is alert and oriented to person, place, and time.  tremor  Skin: Skin is warm and dry.  Psychiatric: He has a normal mood and affect. His behavior is normal.    BP 90/63 (BP Location: Right Arm, Patient Position: Sitting, Cuff Size: Normal)   Pulse 60   Temp 97.6 F (36.4 C) (Oral)   Resp 16   Ht 5' 9.5" (1.765 m)   Wt 148 lb 6.4 oz (67.3 kg)   SpO2 99%   BMI 21.60 kg/m  Wt Readings from Last 3 Encounters:  10/10/16 148 lb 6.4 oz (67.3 kg)  03/28/16 146 lb 2 oz (66.3 kg)  09/26/15 144 lb 8 oz (65.5 kg)     Lab Results  Component Value Date   WBC 6.5 03/28/2016   HGB 15.3 03/28/2016   HCT 46.1 03/28/2016   PLT 200.0 03/28/2016   GLUCOSE 92 03/28/2016   CHOL 144 03/28/2016   TRIG 53.0 03/28/2016   HDL  60.50 03/28/2016   LDLCALC 73 03/28/2016   ALT 5 03/28/2016   AST 22 03/28/2016   NA 138 03/28/2016   K 4.3 03/28/2016   CL 103 03/28/2016   CREATININE 0.76 03/28/2016   BUN 20 03/28/2016   CO2 31 03/28/2016   TSH 1.89 03/28/2016   PSA 4.14 (H) 04/30/2013   HGBA1C 5.7 03/28/2016   MICROALBUR <0.7 09/26/2015    Lab Results  Component Value Date  TSH 1.89 03/28/2016   Lab Results  Component Value Date   WBC 6.5 03/28/2016   HGB 15.3 03/28/2016   HCT 46.1 03/28/2016   MCV 93.4 03/28/2016   PLT 200.0 03/28/2016   Lab Results  Component Value Date   NA 138 03/28/2016   K 4.3 03/28/2016   CO2 31 03/28/2016   GLUCOSE 92 03/28/2016   BUN 20 03/28/2016   CREATININE 0.76 03/28/2016   BILITOT 0.5 03/28/2016   ALKPHOS 104 03/28/2016   AST 22 03/28/2016   ALT 5 03/28/2016   PROT 6.6 03/28/2016   ALBUMIN 4.0 03/28/2016   CALCIUM 9.0 03/28/2016   GFR 106.13 03/28/2016   Lab Results  Component Value Date   CHOL 144 03/28/2016   Lab Results  Component Value Date   HDL 60.50 03/28/2016   Lab Results  Component Value Date   LDLCALC 73 03/28/2016   Lab Results  Component Value Date   TRIG 53.0 03/28/2016   Lab Results  Component Value Date   CHOLHDL 2 03/28/2016   Lab Results  Component Value Date   HGBA1C 5.7 03/28/2016       Assessment & Plan:   Problem List Items Addressed This Visit    Weight loss    Weight stable. Encouraged high protein intake      Relevant Orders   TSH   CBC   Hyperglycemia    hgba1c acceptable, minimize simple carbs. Increase exercise as tolerated.       Relevant Orders   Comprehensive metabolic panel   COPD (chronic obstructive pulmonary disease) (HCC)    Mild increase in congestion but no fevers or SOB. Encouraged increased rest and hydration, add probiotics, zinc such as Coldeze or Xicam. Treat fevers as needed      Hyperlipidemia, mixed    Tolerating statin, encouraged heart healthy diet, avoid trans fats, minimize  simple carbs and saturated fats. Increase exercise as tolerated      Relevant Orders   Lipid panel   Preventative health care - Primary    Patient encouraged to maintain heart healthy diet, regular exercise, adequate sleep. Consider daily probiotics. Take medications as prescribed. Given and reviewed copy of ACP documents from [facility] Secretary of State and encouraged to complete and return       Other Visit Diagnoses   None.     I am having Mr. Campa maintain his aspirin, CIALIS, albuterol, ipratropium, clonazePAM, ondansetron, escitalopram, primidone, carbidopa-levodopa, donepezil, azelastine, and atorvastatin.  No orders of the defined types were placed in this encounter.    Danise EdgeBLYTH, Rangel Echeverri, MD

## 2016-10-11 NOTE — Assessment & Plan Note (Signed)
Weight stable. Encouraged high protein intake

## 2016-10-11 NOTE — Assessment & Plan Note (Signed)
Mild increase in congestion but no fevers or SOB. Encouraged increased rest and hydration, add probiotics, zinc such as Coldeze or Xicam. Treat fevers as needed

## 2016-10-11 NOTE — Assessment & Plan Note (Signed)
Tolerating statin, encouraged heart healthy diet, avoid trans fats, minimize simple carbs and saturated fats. Increase exercise as tolerated 

## 2016-10-11 NOTE — Assessment & Plan Note (Signed)
Patient encouraged to maintain heart healthy diet, regular exercise, adequate sleep. Consider daily probiotics. Take medications as prescribed. Given and reviewed copy of ACP documents from [place] Secretary of State and encouraged to complete and return 

## 2017-01-01 ENCOUNTER — Telehealth: Payer: Self-pay | Admitting: *Deleted

## 2017-01-01 NOTE — Telephone Encounter (Signed)
thanks

## 2017-01-01 NOTE — Telephone Encounter (Signed)
Called pt schedule AWV. Wife states [facility] and requested that I cancel all future appts and remove Dr. Abner GreenspanBlyth as PCP, which I have done.  She requested that I let Dr. Abner GreenspanBlyth they are very appreciative of all the care she has given them, but that is much easier for [facility] to [facility] vs coming into the office.

## 2017-02-05 ENCOUNTER — Other Ambulatory Visit: Payer: Self-pay | Admitting: Family Medicine

## 2017-04-10 ENCOUNTER — Ambulatory Visit: Payer: Medicare Other | Admitting: Family Medicine

## 2017-10-27 ENCOUNTER — Encounter (HOSPITAL_BASED_OUTPATIENT_CLINIC_OR_DEPARTMENT_OTHER): Payer: Self-pay | Admitting: *Deleted

## 2017-10-27 ENCOUNTER — Emergency Department (HOSPITAL_BASED_OUTPATIENT_CLINIC_OR_DEPARTMENT_OTHER): Payer: Medicare Other

## 2017-10-27 ENCOUNTER — Emergency Department (HOSPITAL_BASED_OUTPATIENT_CLINIC_OR_DEPARTMENT_OTHER)
Admission: EM | Admit: 2017-10-27 | Discharge: 2017-10-27 | Disposition: A | Discharge status: Home / Self Care | Payer: Medicare Other | Attending: Emergency Medicine | Admitting: Emergency Medicine

## 2017-10-27 DIAGNOSIS — Z87891 Personal history of nicotine dependence: Secondary | ICD-10-CM | POA: Diagnosis not present

## 2017-10-27 DIAGNOSIS — R110 Nausea: Secondary | ICD-10-CM | POA: Diagnosis not present

## 2017-10-27 DIAGNOSIS — K5909 Other constipation: Secondary | ICD-10-CM

## 2017-10-27 DIAGNOSIS — J449 Chronic obstructive pulmonary disease, unspecified: Secondary | ICD-10-CM | POA: Insufficient documentation

## 2017-10-27 DIAGNOSIS — K5900 Constipation, unspecified: Secondary | ICD-10-CM | POA: Insufficient documentation

## 2017-10-27 DIAGNOSIS — Z7982 Long term (current) use of aspirin: Secondary | ICD-10-CM | POA: Insufficient documentation

## 2017-10-27 DIAGNOSIS — J45909 Unspecified asthma, uncomplicated: Secondary | ICD-10-CM | POA: Diagnosis not present

## 2017-10-27 LAB — URINALYSIS, ROUTINE W REFLEX MICROSCOPIC
Bilirubin Urine: NEGATIVE
GLUCOSE, UA: NEGATIVE mg/dL
HGB URINE DIPSTICK: NEGATIVE
KETONES UR: 15 mg/dL — AB
LEUKOCYTES UA: NEGATIVE
Nitrite: NEGATIVE
PROTEIN: NEGATIVE mg/dL
Specific Gravity, Urine: >1.03 — ABNORMAL HIGH (ref 1.005–1.030)
pH: 5.5 (ref 5.0–8.0)

## 2017-10-27 LAB — CBC WITH DIFFERENTIAL/PLATELET
Basophils Absolute: 0 10*3/uL (ref 0.0–0.1)
Basophils Relative: 0 %
EOS PCT: 3 %
Eosinophils Absolute: 0.2 10*3/uL (ref 0.0–0.7)
HEMATOCRIT: 41.8 % (ref 39.0–52.0)
Hemoglobin: 13.8 g/dL (ref 13.0–17.0)
LYMPHS ABS: 0.9 10*3/uL (ref 0.7–4.0)
LYMPHS PCT: 12 %
MCH: 31.4 pg (ref 26.0–34.0)
MCHC: 33 g/dL (ref 30.0–36.0)
MCV: 95.2 fL (ref 78.0–100.0)
MONO ABS: 0.6 10*3/uL (ref 0.1–1.0)
MONOS PCT: 9 %
NEUTROS ABS: 5.3 10*3/uL (ref 1.7–7.7)
Neutrophils Relative %: 76 %
PLATELETS: 205 10*3/uL (ref 150–400)
RBC: 4.39 MIL/uL (ref 4.22–5.81)
RDW: 12.6 % (ref 11.5–15.5)
WBC: 7 10*3/uL (ref 4.0–10.5)

## 2017-10-27 LAB — COMPREHENSIVE METABOLIC PANEL
ALT: <5 U/L — ABNORMAL LOW (ref 17–63)
AST: 23 U/L (ref 15–41)
Albumin: 3.4 g/dL — ABNORMAL LOW (ref 3.5–5.0)
Alkaline Phosphatase: 91 U/L (ref 38–126)
Anion gap: 6 (ref 5–15)
BILIRUBIN TOTAL: 0.3 mg/dL (ref 0.3–1.2)
BUN: 25 mg/dL — AB (ref 6–20)
CHLORIDE: 101 mmol/L (ref 101–111)
CO2: 29 mmol/L (ref 22–32)
CREATININE: 0.79 mg/dL (ref 0.61–1.24)
Calcium: 8.4 mg/dL — ABNORMAL LOW (ref 8.9–10.3)
Glucose, Bld: 76 mg/dL (ref 65–99)
POTASSIUM: 3.9 mmol/L (ref 3.5–5.1)
Sodium: 136 mmol/L (ref 135–145)
TOTAL PROTEIN: 6.2 g/dL — AB (ref 6.5–8.1)

## 2017-10-27 LAB — LIPASE, BLOOD: LIPASE: 30 U/L (ref 11–51)

## 2017-10-27 NOTE — ED Notes (Signed)
Alert, NAD, calm, interactive, resps e/u, speaking in clear complete sentences, no dyspnea noted, skin W&D, VSS, here for nausea and abd discomfort, reports sx resolved, "seem to be better now", per pt and family, (denies: sx at this time). [facility]

## 2017-10-27 NOTE — ED Triage Notes (Signed)
Pt arrives with his family. Tonight they report he has had nausea, abdominal distention and unsure of when he last had a bowel movement. Hx of dementia

## 2017-10-27 NOTE — ED Notes (Signed)
Not in room, pt in xray. 

## 2017-10-27 NOTE — Discharge Instructions (Signed)
You were seen today for nausea and possible constipation.  Your workup is reassuring.  Make sure you are eating high-fiber foods.  If you develop any new or worsening symptoms you need to be reevaluated immediately.

## 2017-10-27 NOTE — ED Notes (Signed)
EDP into room, prior to RN assessment, see MD notes, pending orders.   

## 2017-10-27 NOTE — ED Notes (Signed)
Delay in EKG due to pt transported to CT.

## 2017-10-27 NOTE — ED Provider Notes (Signed)
[facility] EMERGENCY DEPARTMENT Provider Note   CSN: [id number] Arrival date & time: 10/27/17  2130     History   Chief Complaint Chief Complaint  Patient presents with  . Nausea    HPI Brandon Young. is a 76 y.o. male.  HPI  This is a 76 year old male with a history of dementia, asthma, COPD who presents with concerns for constipation and abdominal pain.  He presents with his wife and a friend.  Wife states that he was complaining of nausea and abdominal pain prior to arrival.  Last bowel movement unknown.  Patient is only oriented to himself.  When asked if he is having any discomfort he states "no."  Denies any chest pain or shortness of breath.  Wife denies any recent fevers or illnesses.  Level 5 caveat for dementia  Past Medical History:  Diagnosis Date  . Acute bronchitis 04/14/2013  . Asthma    childhood- age 48  . COPD (chronic obstructive pulmonary disease) (HCC) 05/02/2013  . Dementia   . Elevated PSA 08/22/2013  . History of blood transfusion 1988  . History of chicken pox    childhood  . Medicare annual wellness visit, subsequent 10/01/2015  . Other and unspecified hyperlipidemia 08/22/2013  . Preventative health care 10/11/2016  . Ulcer of abdomen wall (HCC) 1988  . 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 Preventative health care 10/11/2016  . Medicare annual wellness visit, subsequent 10/01/2015  . 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 Elevated PSA 08/22/2013  . Hyperlipidemia, mixed 08/22/2013  . COPD (chronic obstructive pulmonary disease) (HCC) 05/02/2013  . Dementia   . H/O inguinal hernia repair 06/17/2012  . Hyperglycemia 06/02/2012  . Weight loss 03/31/2012  . Tremor 03/31/2012  . PUD (peptic ulcer disease) 03/31/2012  . ED (erectile dysfunction) 03/31/2012  . Inguinal hernia 03/31/2012    Past Surgical History:  Procedure Laterality Date  . ABDOMINAL SURGERY  1988   bleed ulcer   . HERNIA REPAIR  2013  . TONSILLECTOMY         Home Medications    Prior to Admission medications   Medication Sig Start Date End Date Taking? Authorizing Provider  albuterol (PROVENTIL HFA;VENTOLIN HFA) 108 (90 BASE) MCG/ACT inhaler Inhale 2 puffs into the lungs every 6 (six) hours as needed for wheezing or shortness of breath. 04/30/13   Bradd Canary, MD  aspirin 81 MG tablet Take 81 mg by mouth daily.    [provider]  atorvastatin (LIPITOR) 10 MG tablet TAKE 1 TABLET BY MOUTH ONCE DAILY 02/06/17   Bradd Canary, MD  azelastine (ASTELIN) 0.1 % nasal spray Place 2 sprays into both nostrils 2 (two) times daily as needed for rhinitis. Use in each nostril as directed 03/28/16   Bradd Canary, MD  carbidopa-levodopa (SINEMET IR) 25-100 MG per tablet Take 2 tablets by mouth 3 (three) times daily.    [provider]  CIALIS 20 MG tablet TAKE 1 TABLET BY MOUTH EVERY 36 HOURS AS NEEDED 12/31/12   Edwyna Perfect, MD  clonazePAM (KLONOPIN) 0.5 MG tablet Take 1 tablet (0.5 mg total) by mouth daily as needed for anxiety. 03/17/14   Penumalli, Glenford Bayley, MD  donepezil (ARICEPT) 10 MG tablet TAKE 1 TABLET BY MOUTH AT BEDTIME 12/18/15   Penumalli, Glenford Bayley, MD  escitalopram (LEXAPRO) 10 MG tablet TAKE 1 TABLET EVERY DAY 10/19/14   Ronal Fear, NP  ipratropium (ATROVENT) 0.03 % nasal spray  01/30/14   [provider]  ondansetron (ZOFRAN-ODT) 4 MG disintegrating tablet Take 1 tablet (4 mg total) by mouth every 8 (eight) hours as needed for nausea. 06/22/14   Penumalli, Glenford Bayley, MD  primidone (MYSOLINE) 50 MG tablet Tapering scale 10/19/14   [provider]    Family History Family History  Problem Relation Age of Onset  . Hypertension Mother   . Dementia Mother   . Other Father        GI infection/disturbance  . Tremor Father   . COPD Sister        smoker  . Tremor Sister   . Tremor Brother   . Tremor Sister   . Colon cancer Neg Hx   . Heart disease Neg  Hx   . Prostate cancer Neg Hx   . Breast cancer Neg Hx   . Diabetes Neg Hx     Social History Social History   Tobacco Use  . Smoking status: Former Games developer  . Smokeless tobacco: Never Used  . Tobacco comment: 1 ppd for 30 years  Substance Use Topics  . Alcohol use: Yes    Comment: wine  . Drug use: No     Allergies   Patient has no known allergies.   Review of Systems Review of Systems  Unable to perform ROS: Dementia  Constitutional: Negative for fever.  Respiratory: Negative for shortness of breath.   Cardiovascular: Negative for chest pain.  Gastrointestinal: Positive for abdominal pain, constipation and nausea. Negative for vomiting.     Physical Exam Updated Vital Signs BP 111/74 (BP Location: Left Arm)   Pulse (!) 56   Temp 98 F (36.7 C) (Oral)   Resp 13   SpO2 98%   Physical Exam  Constitutional: He appears well-developed and well-nourished.  Chronically ill-appearing, no acute distress, resting tremor noted  HENT:  Head: Normocephalic and atraumatic.  Eyes: Pupils are equal, round, and reactive to light.  Neck: Neck supple.  Cardiovascular: Normal rate, regular rhythm and normal heart sounds.  No murmur heard. Pulmonary/Chest: Effort normal and breath sounds normal. No respiratory distress. He has no wheezes.  Abdominal: Soft. Bowel sounds are normal. There is no tenderness. There is no rebound.  Midline abdominal scar noted, no tenderness, rebound, or guarding  Musculoskeletal: He exhibits no edema.  Neurological: He is alert.  Oriented only to self, resting tremor noted  Skin: Skin is warm and dry.  Psychiatric: He has a normal mood and affect.  Nursing note and vitals reviewed.    ED Treatments / Results  Labs (all labs ordered are listed, but only abnormal results are displayed) Labs Reviewed  COMPREHENSIVE METABOLIC PANEL - Abnormal; Notable for the following components:      Result Value   BUN 25 (*)    Calcium 8.4 (*)    Total  Protein 6.2 (*)    Albumin 3.4 (*)    ALT <5 (*)    All other components within normal limits  URINALYSIS, ROUTINE W REFLEX MICROSCOPIC - Abnormal; Notable for the following components:   Specific Gravity, Urine >1.030 (*)    Ketones, ur 15 (*)    All other components within normal limits  CBC WITH DIFFERENTIAL/PLATELET  LIPASE, BLOOD    EKG  EKG Interpretation  Date/Time:  Monday October 27 2017 01:26:50 EST Ventricular Rate:  59 PR Interval:    QRS Duration: 84 QT Interval:  433 QTC Calculation: 429 R Axis:  78 Text Interpretation:  Sinus rhythm Anteroseptal infarct, age indeterminate Artifact in lead(s) V3 and baseline wander in lead(s) V3 No prior for comparison Confirmed by Ross MarcusHorton, Esly Selvage ([id number]) on 10/27/2017 2:04:27 AM       Radiology Dg Abdomen Acute W/chest  Result Date: 10/27/2017 CLINICAL DATA:  Nausea for 3 days.  Abdominal distention. EXAM: DG ABDOMEN ACUTE W/ 1V CHEST COMPARISON:  Chest 04/12/2013 FINDINGS: Emphysematous changes and scattered fibrosis in the lungs. No airspace disease or consolidation. Heart size and pulmonary vascularity are normal. Mediastinal contours appear intact. Gas and stool scattered throughout the colon. No small or large bowel distention. No free intra-abdominal air. No abnormal air-fluid levels. No radiopaque stones. Degenerative changes in the spine. Vascular calcifications. IMPRESSION: Emphysematous changes in the lungs. No evidence of active pulmonary disease. Nonobstructive bowel gas pattern. Electronically Signed   By: Burman NievesWilliam  Stevens M.D.   On: 10/27/2017 01:56    Procedures Procedures (including critical care time)  Medications Ordered in ED Medications - No data to display   Initial Impression / Assessment and Plan / ED Course  I have reviewed the triage vital signs and the nursing notes.  Pertinent labs & imaging results that were available during my care of the patient were reviewed by me and considered in my medical  decision making (see chart for details).     Presents with concerns for nausea and abdominal pain.  Currently he is without complaints; however, history is limited secondary to dementia.  Basic lab work obtained including liver function tests and lipase.  EKG shows no evidence of ischemia.  X-rays without significant stool burden.  Screening lab work is largely reassuring.  He remains asymptomatic while in the ED.  Given benign exam, I do not feel he needs further workup at this time.  Discussed the workup and results with the patient's wife.  He is tolerating fluids without difficulty.  Close primary follow-up is recommended.  After history, exam, and medical workup I feel the patient has been appropriately medically screened and is safe for discharge home. Pertinent diagnoses were discussed with the patient. Patient was given return precautions.   Final Clinical Impressions(s) / ED Diagnoses   Final diagnoses:  Nausea  Other constipation    ED Discharge Orders    None       Shon BatonHorton, Miraya Cudney F, MD 10/27/17 [phone]

## 2017-11-06 ENCOUNTER — Other Ambulatory Visit: Payer: Self-pay | Admitting: Family Medicine

## 2018-07-31 ENCOUNTER — Other Ambulatory Visit: Payer: Self-pay | Admitting: Family Medicine

## 2018-08-17 ENCOUNTER — Other Ambulatory Visit (HOSPITAL_COMMUNITY): Payer: Self-pay | Admitting: Internal Medicine

## 2018-08-17 DIAGNOSIS — R1310 Dysphagia, unspecified: Secondary | ICD-10-CM

## 2018-08-20 ENCOUNTER — Ambulatory Visit (HOSPITAL_COMMUNITY)
Admission: RE | Admit: 2018-08-20 | Discharge: 2018-08-20 | Disposition: A | Discharge status: Home / Self Care | Payer: Medicare Other | Source: Ambulatory Visit | Attending: Internal Medicine | Admitting: Internal Medicine

## 2018-08-20 DIAGNOSIS — R1310 Dysphagia, unspecified: Secondary | ICD-10-CM | POA: Insufficient documentation

## 2018-08-20 NOTE — Progress Notes (Signed)
Modified Barium Swallow Progress Note  Patient Details  Name: Brandon QuailMorrell B Briddell Jr. MRN: [id number] Date of Birth: 07/16/1941  Today's Date: 08/20/2018  Modified Barium Swallow completed.  Full report located under Chart Review in the Imaging Section.  Brief recommendations include the following:  Clinical Impression  Pt presents with a mild oral dysphagia with potential risk of aspiration. Pt suffers from UE and oral/lingual tremor while being fed and feeding himself with lidded cup. Pt orally holds liquids briefly with a tremor and resulting premature spill to the pyriform sinuses. Despite signifciant spillage, even with rapid rate and alrge boluses, no aspiration occurred during this study. Only one instance of trace flash penetration occurred when taking a pill with liquids. There was also mild oral residue that spills into pharynx post swallow that is cleared with a second swallow. Overall, pt has a functional swallow mechanism, though suspect aspiration events may occur somewhat regularly, which are likely sensed. Advised pt to continue slow rate, small sips, self feeding with a straw, but to be aware of potential for deterioration of swallow function with further decline or acute illness. May benefit from intermittent treatment for aspiration precautions, monitoring, and second swallow compensatory strategy, though strength is quite good and strengthening does not appear warranted.    Swallow Evaluation Recommendations       SLP Diet Recommendations: Regular solids;Thin liquid   Liquid Administration via: Straw   Medication Administration: Whole meds with puree   Supervision: Staff to assist with self feeding(as neede)   Compensations: Slow rate;Small sips/bites;Multiple dry swallows after each bite/sip   Postural Changes: Seated upright at 90 degrees   Oral Care Recommendations: Oral care BID       Harlon DittyBonnie Yeraldy Spike, MA CCC-SLP [phone]  Claudine MoutonDeBlois, Brandon Young  Caroline 08/20/2018,3:19 PM

## 2020-04-15 ENCOUNTER — Inpatient Hospital Stay (HOSPITAL_COMMUNITY)
Admission: EM | Admit: 2020-04-15 | Discharge: 2020-04-19 | DRG: 481 | Disposition: A | Discharge status: Skilled Nursing Facility | Payer: Medicare PPO | Attending: Internal Medicine | Admitting: Internal Medicine

## 2020-04-15 ENCOUNTER — Emergency Department (HOSPITAL_COMMUNITY): Payer: Medicare PPO

## 2020-04-15 DIAGNOSIS — F0390 Unspecified dementia without behavioral disturbance: Secondary | ICD-10-CM | POA: Diagnosis present

## 2020-04-15 DIAGNOSIS — G20 Parkinson's disease: Secondary | ICD-10-CM | POA: Diagnosis present

## 2020-04-15 DIAGNOSIS — Z79890 Hormone replacement therapy (postmenopausal): Secondary | ICD-10-CM

## 2020-04-15 DIAGNOSIS — R64 Cachexia: Secondary | ICD-10-CM | POA: Diagnosis present

## 2020-04-15 DIAGNOSIS — W010XXA Fall on same level from slipping, tripping and stumbling without subsequent striking against object, initial encounter: Secondary | ICD-10-CM | POA: Diagnosis present

## 2020-04-15 DIAGNOSIS — Y92129 Unspecified place in nursing home as the place of occurrence of the external cause: Secondary | ICD-10-CM

## 2020-04-15 DIAGNOSIS — J449 Chronic obstructive pulmonary disease, unspecified: Secondary | ICD-10-CM | POA: Diagnosis present

## 2020-04-15 DIAGNOSIS — E785 Hyperlipidemia, unspecified: Secondary | ICD-10-CM | POA: Diagnosis present

## 2020-04-15 DIAGNOSIS — F0280 Dementia in other diseases classified elsewhere without behavioral disturbance: Secondary | ICD-10-CM | POA: Diagnosis present

## 2020-04-15 DIAGNOSIS — Z825 Family history of asthma and other chronic lower respiratory diseases: Secondary | ICD-10-CM

## 2020-04-15 DIAGNOSIS — Z96649 Presence of unspecified artificial hip joint: Secondary | ICD-10-CM

## 2020-04-15 DIAGNOSIS — D62 Acute posthemorrhagic anemia: Secondary | ICD-10-CM | POA: Diagnosis not present

## 2020-04-15 DIAGNOSIS — Z87891 Personal history of nicotine dependence: Secondary | ICD-10-CM

## 2020-04-15 DIAGNOSIS — Z7982 Long term (current) use of aspirin: Secondary | ICD-10-CM

## 2020-04-15 DIAGNOSIS — Z20822 Contact with and (suspected) exposure to covid-19: Secondary | ICD-10-CM | POA: Diagnosis present

## 2020-04-15 DIAGNOSIS — Z8711 Personal history of peptic ulcer disease: Secondary | ICD-10-CM

## 2020-04-15 DIAGNOSIS — Z79899 Other long term (current) drug therapy: Secondary | ICD-10-CM

## 2020-04-15 DIAGNOSIS — S72002A Fracture of unspecified part of neck of left femur, initial encounter for closed fracture: Secondary | ICD-10-CM

## 2020-04-15 DIAGNOSIS — Z6821 Body mass index (BMI) 21.0-21.9, adult: Secondary | ICD-10-CM

## 2020-04-15 DIAGNOSIS — Z66 Do not resuscitate: Secondary | ICD-10-CM | POA: Diagnosis present

## 2020-04-15 DIAGNOSIS — S72142A Displaced intertrochanteric fracture of left femur, initial encounter for closed fracture: Secondary | ICD-10-CM | POA: Diagnosis not present

## 2020-04-15 DIAGNOSIS — R296 Repeated falls: Secondary | ICD-10-CM | POA: Diagnosis present

## 2020-04-15 DIAGNOSIS — Z419 Encounter for procedure for purposes other than remedying health state, unspecified: Secondary | ICD-10-CM

## 2020-04-15 LAB — CBC WITH DIFFERENTIAL/PLATELET
Abs Immature Granulocytes: 0.03 10*3/uL (ref 0.00–0.07)
Basophils Absolute: 0 10*3/uL (ref 0.0–0.1)
Basophils Relative: 0 %
Eosinophils Absolute: 0 10*3/uL (ref 0.0–0.5)
Eosinophils Relative: 0 %
HCT: 35.3 % — ABNORMAL LOW (ref 39.0–52.0)
Hemoglobin: 10.9 g/dL — ABNORMAL LOW (ref 13.0–17.0)
Immature Granulocytes: 0 %
Lymphocytes Relative: 7 %
Lymphs Abs: 0.6 10*3/uL — ABNORMAL LOW (ref 0.7–4.0)
MCH: 29.7 pg (ref 26.0–34.0)
MCHC: 30.9 g/dL (ref 30.0–36.0)
MCV: 96.2 fL (ref 80.0–100.0)
Monocytes Absolute: 0.6 10*3/uL (ref 0.1–1.0)
Monocytes Relative: 7 %
Neutro Abs: 7.5 10*3/uL (ref 1.7–7.7)
Neutrophils Relative %: 86 %
Platelets: 189 10*3/uL (ref 150–400)
RBC: 3.67 MIL/uL — ABNORMAL LOW (ref 4.22–5.81)
RDW: 14.6 % (ref 11.5–15.5)
WBC: 8.8 10*3/uL (ref 4.0–10.5)
nRBC: 0 % (ref 0.0–0.2)

## 2020-04-15 LAB — PROTIME-INR
INR: 1.2 (ref 0.8–1.2)
Prothrombin Time: 15.1 seconds (ref 11.4–15.2)

## 2020-04-15 MED ORDER — LACTATED RINGERS IV SOLN: INTRAVENOUS | Status: DC

## 2020-04-15 MED ORDER — MORPHINE SULFATE (PF) 2 MG/ML IV SOLN
2.0000 mg | Freq: Once | INTRAVENOUS | Status: AC
  Administered 2020-04-15: 23:00:00 2 mg via INTRAVENOUS
  Filled 2020-04-15: qty 1

## 2020-04-15 NOTE — ED Notes (Signed)
Pt lying in bed flat and awake but not speaking. Attempted to contact family multiple times. Will continue to monitor.

## 2020-04-15 NOTE — ED Triage Notes (Signed)
Pt brought in by EMS from SNF for a fall while standing. Pt has advanced dementia and advanced parkinson's. Per SNF pt is at baseline after fall. Pt not answering questions but awake. Will continue to monitor. Abrasion to the left elbow.

## 2020-04-15 NOTE — ED Provider Notes (Signed)
[facility]-EMERGENCY DEPT Provider Note   CSN: [id number] Arrival date & time: 04/15/20  2122     History Chief Complaint  Patient presents with  . Fall    Brandon Young. is a 79 y.o. male.  79 yo M w a chief complaint of a fall.  The patient was ambulating with staff at the nursing facility when he lost his balance and fell.  They thought he was having pain to the left elbow and his left hip.  They try to get in contact with the family but were unable.  He is currently listed as comfort care only.  He is ambulatory.  Severely demented with Parkinson's.  Level 5 caveat dementia.  The history is provided by the patient and the EMS personnel.  Fall Pertinent negatives include no chest pain, no abdominal pain, no headaches and no shortness of breath.       Past Medical History:  Diagnosis Date  . Acute bronchitis 04/14/2013  . Asthma    childhood- age 71  . COPD (chronic obstructive pulmonary disease) (HCC) 05/02/2013  . Dementia   . Elevated PSA 08/22/2013  . History of blood transfusion 1988  . History of chicken pox    childhood  . Medicare annual wellness visit, subsequent 10/01/2015  . Other and unspecified hyperlipidemia 08/22/2013  . Preventative health care 10/11/2016  . Ulcer of abdomen wall (HCC) 1988  . 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 Preventative health care 10/11/2016  . Medicare annual wellness visit, subsequent 10/01/2015  . 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 Elevated PSA 08/22/2013  . Hyperlipidemia, mixed 08/22/2013  . COPD (chronic obstructive pulmonary disease) (HCC) 05/02/2013  . Dementia (HCC)   . H/O inguinal hernia repair 06/17/2012  . Hyperglycemia 06/02/2012  . Weight loss 03/31/2012  . Tremor 03/31/2012  . PUD (peptic ulcer disease) 03/31/2012  . ED (erectile dysfunction) 03/31/2012  . Inguinal hernia 03/31/2012    Past Surgical History:  Procedure Laterality  Date  . ABDOMINAL SURGERY  1988   bleed ulcer  . HERNIA REPAIR  2013  . TONSILLECTOMY         Family History  Problem Relation Age of Onset  . Hypertension Mother   . Dementia Mother   . Other Father        GI infection/disturbance  . Tremor Father   . COPD Sister        smoker  . Tremor Sister   . Tremor Brother   . Tremor Sister   . Colon cancer Neg Hx   . Heart disease Neg Hx   . Prostate cancer Neg Hx   . Breast cancer Neg Hx   . Diabetes Neg Hx     Social History   Tobacco Use  . Smoking status: Former Games developer  . Smokeless tobacco: Never Used  . Tobacco comment: 1 ppd for 30 years  Substance Use Topics  . Alcohol use: Yes    Comment: wine  . Drug use: No    Home Medications Prior to Admission medications   Medication Sig Start Date End Date Taking? Authorizing Provider  acetaminophen (TYLENOL) 325 MG tablet Take 650 mg by mouth every 4 (four) hours as needed for mild pain.   Yes [provider]  aspirin 81 MG tablet Take 81 mg by mouth daily.   Yes [provider]  atorvastatin (LIPITOR) 10 MG tablet TAKE 1 TABLET BY MOUTH  ONCE DAILY Patient taking differently: Take 10 mg by mouth daily.  11/07/17  Yes Bradd Canary, MD  carbidopa-levodopa (SINEMET IR) 25-100 MG per tablet Take 2 tablets by mouth in the morning and at bedtime.    Yes [provider]  donepezil (ARICEPT) 10 MG tablet TAKE 1 TABLET BY MOUTH AT BEDTIME Patient taking differently: Take 10 mg by mouth at bedtime.  12/18/15  Yes Penumalli, Vikram R, MD  escitalopram (LEXAPRO) 10 MG tablet TAKE 1 TABLET EVERY DAY Patient taking differently: Take 10 mg by mouth daily.  10/19/14  Yes Ronal Fear, NP  furosemide (LASIX) 20 MG tablet Take 10 mg by mouth See admin instructions. Every two days starting 01/04/20   Yes [provider]  ipratropium-albuterol (DUONEB) 0.5-2.5 (3) MG/3ML SOLN Take 3 mLs by nebulization in the morning, at noon, and at bedtime.   Yes  [provider]  loratadine (CLARITIN) 10 MG tablet Take 10 mg by mouth daily.   Yes [provider]  melatonin 3 MG TABS tablet Take 3 mg by mouth at bedtime.   Yes [provider]  Multiple Vitamin (MULTIVITAMIN WITH MINERALS) TABS tablet Take 1 tablet by mouth daily.   Yes [provider]  omeprazole (PRILOSEC) 20 MG capsule Take 20 mg by mouth 2 (two) times daily before a meal.   Yes [provider]  ondansetron (ZOFRAN-ODT) 4 MG disintegrating tablet Take 1 tablet (4 mg total) by mouth every 8 (eight) hours as needed for nausea. 06/22/14  Yes Penumalli, Glenford Bayley, MD  primidone (MYSOLINE) 50 MG tablet Take 100 mg by mouth daily. Tapering scale 10/19/14  Yes [provider]  risperiDONE (RISPERDAL) 0.25 MG tablet Take 0.125 mg by mouth 2 (two) times daily.   Yes [provider]  tamsulosin (FLOMAX) 0.4 MG CAPS capsule Take 0.4 mg by mouth daily.   Yes [provider]  vitamin C (ASCORBIC ACID) 250 MG tablet Take 500 mg by mouth daily.   Yes [provider]  Vitamin D, Ergocalciferol, (DRISDOL) 1.25 MG (50000 UNIT) CAPS capsule Take 50,000 Units by mouth every 7 (seven) days.   Yes [provider]  zinc gluconate 50 MG tablet Take 50 mg by mouth daily.   Yes [provider]  albuterol (PROVENTIL HFA;VENTOLIN HFA) 108 (90 BASE) MCG/ACT inhaler Inhale 2 puffs into the lungs every 6 (six) hours as needed for wheezing or shortness of breath. Patient not taking: Reported on 04/15/2020 04/30/13   Bradd Canary, MD  azelastine (ASTELIN) 0.1 % nasal spray Place 2 sprays into both nostrils 2 (two) times daily as needed for rhinitis. Use in each nostril as directed Patient not taking: Reported on 04/15/2020 03/28/16   Bradd Canary, MD  CIALIS 20 MG tablet TAKE 1 TABLET BY MOUTH EVERY 36 HOURS AS NEEDED Patient not taking: Reported on 04/15/2020 12/31/12   Edwyna Perfect, MD  clonazePAM (KLONOPIN) 0.5 MG tablet  Take 1 tablet (0.5 mg total) by mouth daily as needed for anxiety. Patient not taking: Reported on 04/15/2020 03/17/14   Suanne Marker, MD  ipratropium (ATROVENT) 0.03 % nasal spray  01/30/14   [provider]    Allergies    Patient has no known allergies.  Review of Systems   Review of Systems  Unable to perform ROS: Dementia  Constitutional: Negative for chills and fever.  HENT: Negative for congestion and facial swelling.   Eyes: Negative for discharge and visual disturbance.  Respiratory:  Negative for shortness of breath.   Cardiovascular: Negative for chest pain and palpitations.  Gastrointestinal: Negative for abdominal pain, diarrhea and vomiting.  Musculoskeletal: Negative for arthralgias and myalgias.  Skin: Negative for color change and rash.  Neurological: Negative for tremors, syncope and headaches.  Psychiatric/Behavioral: Negative for confusion and dysphoric mood.    Physical Exam Updated Vital Signs BP (!) 188/110   Pulse 65   Temp 97.7 F (36.5 C) (Oral)   Resp 18   SpO2 95%   Physical Exam Vitals and nursing note reviewed.  Constitutional:      Appearance: He is well-developed.     Comments: Cachectic  HENT:     Head: Normocephalic and atraumatic.  Eyes:     Pupils: Pupils are equal, round, and reactive to light.  Neck:     Vascular: No JVD.  Cardiovascular:     Rate and Rhythm: Normal rate and regular rhythm.     Heart sounds: No murmur. No friction rub. No gallop.   Pulmonary:     Effort: No respiratory distress.     Breath sounds: No wheezing.  Abdominal:     General: There is no distension.     Tenderness: There is no guarding or rebound.  Musculoskeletal:        General: Normal range of motion.     Cervical back: Normal range of motion and neck supple.     Comments: Shortened left lower extremity.  Apparent pain with internal and external rotation.  Skin:    Coloration: Skin is not pale.     Findings: No rash.  Neurological:      Mental Status: He is alert.     Comments: Coarse tremor     ED Results / Procedures / Treatments   Labs (all labs ordered are listed, but only abnormal results are displayed) Labs Reviewed  RESPIRATORY PANEL BY RT PCR (FLU A&B, COVID)  BASIC METABOLIC PANEL  CBC WITH DIFFERENTIAL/PLATELET  PROTIME-INR  TYPE AND SCREEN    EKG None  Radiology DG Hip Unilat W or Wo Pelvis 2-3 Views Left  Result Date: 04/15/2020 CLINICAL DATA:  79 year old male with fall and left hip pain. EXAM: DG HIP (WITH OR WITHOUT PELVIS) 2-3V LEFT COMPARISON:  None. FINDINGS: There is a minimally displaced basicervical or intertrochanteric fracture of the left femoral neck. No dislocation. The bones are osteopenic. The soft tissues are unremarkable. IMPRESSION: Minimally displaced fracture of the left femoral neck. Electronically Signed   By: Anner Crete M.D.   On: 04/15/2020 22:21    Procedures Procedures (including critical care time)  Medications Ordered in ED Medications  lactated ringers infusion ( Intravenous New Bag/Given 04/15/20 2321)  morphine 2 MG/ML injection 2 mg (2 mg Intravenous Given 04/15/20 2322)    ED Course  I have reviewed the triage vital signs and the nursing notes.  Pertinent labs & imaging results that were available during my care of the patient were reviewed by me and considered in my medical decision making (see chart for details).    MDM Rules/Calculators/A&P                      79 yo M with a chief complaints of left hip pain after fall.  This was witnessed by the nursing staff.  Deny hitting his head.  Patient has left femoral neck fracture.  I am not sure of the wishes of the family.  I have tried to get in touch with them  to discuss possible repair versus comfort management. Will plan on likely admission with hopes that the family could be contacted in the morning for discussion.   I discussed case with Dr. Roda Shutters, orthopedics.  Recommended that the patient be  transferred to [facility] and admitted under the hospitalist service.  Signed out to Dr. Read Drivers please see their note for further details of care in the ED.  The patients results and plan were reviewed and discussed.   Any x-rays performed were independently reviewed by myself.   Differential diagnosis were considered with the presenting HPI.  Medications  lactated ringers infusion ( Intravenous New Bag/Given 04/15/20 2321)  morphine 2 MG/ML injection 2 mg (2 mg Intravenous Given 04/15/20 2322)    Vitals:   04/15/20 2136 04/15/20 2240  BP: (!) 167/127 (!) 188/110  Pulse: 66 65  Resp: 18 18  Temp: 97.7 F (36.5 C)   TempSrc: Oral   SpO2: 96% 95%    Final diagnoses:  Left displaced femoral neck fracture (HCC)    Admission/ observation were discussed with the admitting physician, patient and/or family and they are comfortable with the plan.     Final Clinical Impression(s) / ED Diagnoses Final diagnoses:  Left displaced femoral neck fracture [facility])    Rx / DC Orders ED Discharge Orders    None       Melene Plan, DO 04/15/20 2324

## 2020-04-15 NOTE — ED Notes (Signed)
Pt lying in bed awake. Full monitor on. Will continue to monitor pt.

## 2020-04-16 ENCOUNTER — Emergency Department (HOSPITAL_COMMUNITY): Payer: Medicare PPO

## 2020-04-16 ENCOUNTER — Encounter (HOSPITAL_COMMUNITY): Payer: Self-pay | Admitting: Family Medicine

## 2020-04-16 DIAGNOSIS — Z20822 Contact with and (suspected) exposure to covid-19: Secondary | ICD-10-CM | POA: Diagnosis present

## 2020-04-16 DIAGNOSIS — S72002A Fracture of unspecified part of neck of left femur, initial encounter for closed fracture: Secondary | ICD-10-CM | POA: Diagnosis present

## 2020-04-16 DIAGNOSIS — Z66 Do not resuscitate: Secondary | ICD-10-CM | POA: Diagnosis present

## 2020-04-16 DIAGNOSIS — G20 Parkinson's disease: Secondary | ICD-10-CM

## 2020-04-16 DIAGNOSIS — R296 Repeated falls: Secondary | ICD-10-CM | POA: Diagnosis present

## 2020-04-16 DIAGNOSIS — D649 Anemia, unspecified: Secondary | ICD-10-CM | POA: Diagnosis not present

## 2020-04-16 DIAGNOSIS — Z7982 Long term (current) use of aspirin: Secondary | ICD-10-CM | POA: Diagnosis not present

## 2020-04-16 DIAGNOSIS — J449 Chronic obstructive pulmonary disease, unspecified: Secondary | ICD-10-CM | POA: Diagnosis present

## 2020-04-16 DIAGNOSIS — Z825 Family history of asthma and other chronic lower respiratory diseases: Secondary | ICD-10-CM | POA: Diagnosis not present

## 2020-04-16 DIAGNOSIS — F0281 Dementia in other diseases classified elsewhere with behavioral disturbance: Secondary | ICD-10-CM | POA: Diagnosis not present

## 2020-04-16 DIAGNOSIS — Z8711 Personal history of peptic ulcer disease: Secondary | ICD-10-CM | POA: Diagnosis not present

## 2020-04-16 DIAGNOSIS — S72142A Displaced intertrochanteric fracture of left femur, initial encounter for closed fracture: Secondary | ICD-10-CM | POA: Diagnosis present

## 2020-04-16 DIAGNOSIS — J418 Mixed simple and mucopurulent chronic bronchitis: Secondary | ICD-10-CM

## 2020-04-16 DIAGNOSIS — W010XXA Fall on same level from slipping, tripping and stumbling without subsequent striking against object, initial encounter: Secondary | ICD-10-CM | POA: Diagnosis present

## 2020-04-16 DIAGNOSIS — E785 Hyperlipidemia, unspecified: Secondary | ICD-10-CM | POA: Diagnosis present

## 2020-04-16 DIAGNOSIS — Z87891 Personal history of nicotine dependence: Secondary | ICD-10-CM | POA: Diagnosis not present

## 2020-04-16 DIAGNOSIS — Z6821 Body mass index (BMI) 21.0-21.9, adult: Secondary | ICD-10-CM | POA: Diagnosis not present

## 2020-04-16 DIAGNOSIS — D62 Acute posthemorrhagic anemia: Secondary | ICD-10-CM | POA: Diagnosis not present

## 2020-04-16 DIAGNOSIS — Y92129 Unspecified place in nursing home as the place of occurrence of the external cause: Secondary | ICD-10-CM | POA: Diagnosis not present

## 2020-04-16 DIAGNOSIS — Z79890 Hormone replacement therapy (postmenopausal): Secondary | ICD-10-CM | POA: Diagnosis not present

## 2020-04-16 DIAGNOSIS — F0280 Dementia in other diseases classified elsewhere without behavioral disturbance: Secondary | ICD-10-CM | POA: Diagnosis present

## 2020-04-16 DIAGNOSIS — Z79899 Other long term (current) drug therapy: Secondary | ICD-10-CM | POA: Diagnosis not present

## 2020-04-16 DIAGNOSIS — R64 Cachexia: Secondary | ICD-10-CM | POA: Diagnosis present

## 2020-04-16 LAB — BASIC METABOLIC PANEL
Anion gap: 8 (ref 5–15)
Anion gap: 7 (ref 5–15)
BUN: 20 mg/dL (ref 8–23)
BUN: 21 mg/dL (ref 8–23)
CO2: 26 mmol/L (ref 22–32)
CO2: 28 mmol/L (ref 22–32)
Calcium: 8 mg/dL — ABNORMAL LOW (ref 8.9–10.3)
Calcium: 8.1 mg/dL — ABNORMAL LOW (ref 8.9–10.3)
Chloride: 104 mmol/L (ref 98–111)
Chloride: 105 mmol/L (ref 98–111)
Creatinine, Ser: 0.8 mg/dL (ref 0.61–1.24)
Creatinine, Ser: 0.67 mg/dL (ref 0.61–1.24)
GFR calc Af Amer: >60 mL/min (ref >60)
GFR calc Af Amer: >60 mL/min (ref >60)
GFR calc non Af Amer: >60 mL/min (ref >60)
GFR calc non Af Amer: >60 mL/min (ref >60)
Glucose, Bld: 132 mg/dL — ABNORMAL HIGH (ref 70–99)
Glucose, Bld: 133 mg/dL — ABNORMAL HIGH (ref 70–99)
Potassium: 4.1 mmol/L (ref 3.5–5.1)
Potassium: 4 mmol/L (ref 3.5–5.1)
Sodium: 138 mmol/L (ref 135–145)
Sodium: 140 mmol/L (ref 135–145)

## 2020-04-16 LAB — RESPIRATORY PANEL BY RT PCR (FLU A&B, COVID)
Influenza A by PCR: NEGATIVE
Influenza B by PCR: NEGATIVE
SARS Coronavirus 2 by RT PCR: NEGATIVE

## 2020-04-16 LAB — CBC
HCT: 34.2 % — ABNORMAL LOW (ref 39.0–52.0)
Hemoglobin: 10.6 g/dL — ABNORMAL LOW (ref 13.0–17.0)
MCH: 29.4 pg (ref 26.0–34.0)
MCHC: 31 g/dL (ref 30.0–36.0)
MCV: 95 fL (ref 80.0–100.0)
Platelets: 181 10*3/uL (ref 150–400)
RBC: 3.6 MIL/uL — ABNORMAL LOW (ref 4.22–5.81)
RDW: 14.6 % (ref 11.5–15.5)
WBC: 8.4 10*3/uL (ref 4.0–10.5)
nRBC: 0 % (ref 0.0–0.2)

## 2020-04-16 LAB — ABO/RH
ABO/RH(D): B POS
ABO/RH(D): B POS

## 2020-04-16 LAB — TYPE AND SCREEN
ABO/RH(D): B POS
ABO/RH(D): B POS
Antibody Screen: NEGATIVE
Antibody Screen: NEGATIVE

## 2020-04-16 MED ORDER — RISPERIDONE 0.25 MG PO TABS
0.1250 mg | ORAL_TABLET | Freq: Two times a day (BID) | ORAL | Status: DC
  Administered 2020-04-16 – 2020-04-19 (×5): 0.125 mg via ORAL
  Filled 2020-04-16 (×8): qty 0.5

## 2020-04-16 MED ORDER — TRANEXAMIC ACID-NACL 1000-0.7 MG/100ML-% IV SOLN
1000.0000 mg | INTRAVENOUS | Status: AC
  Administered 2020-04-17: 16:00:00 1000 mg via INTRAVENOUS
  Filled 2020-04-16: qty 100

## 2020-04-16 MED ORDER — LORAZEPAM 2 MG/ML IJ SOLN
0.5000 mg | Freq: Four times a day (QID) | INTRAMUSCULAR | Status: DC | PRN
  Administered 2020-04-16: 08:00:00 0.5 mg via INTRAVENOUS
  Filled 2020-04-16: qty 1

## 2020-04-16 MED ORDER — DONEPEZIL HCL 10 MG PO TABS
10.0000 mg | ORAL_TABLET | Freq: Every day | ORAL | Status: DC
  Administered 2020-04-16 – 2020-04-18 (×3): 10 mg via ORAL
  Filled 2020-04-16 (×4): qty 1

## 2020-04-16 MED ORDER — PANTOPRAZOLE SODIUM 40 MG PO TBEC
40.0000 mg | DELAYED_RELEASE_TABLET | Freq: Every day | ORAL | Status: DC
  Administered 2020-04-18 – 2020-04-19 (×2): 40 mg via ORAL
  Filled 2020-04-16 (×4): qty 1

## 2020-04-16 MED ORDER — ENSURE PRE-SURGERY PO LIQD
296.0000 mL | Freq: Once | ORAL | Status: AC
  Administered 2020-04-17: 04:00:00 296 mL via ORAL
  Filled 2020-04-16: qty 296

## 2020-04-16 MED ORDER — CARBIDOPA-LEVODOPA 25-100 MG PO TABS
2.0000 | ORAL_TABLET | Freq: Two times a day (BID) | ORAL | Status: DC
  Administered 2020-04-16 – 2020-04-19 (×6): 2 via ORAL
  Filled 2020-04-16 (×7): qty 2

## 2020-04-16 MED ORDER — MORPHINE SULFATE (PF) 2 MG/ML IV SOLN
1.0000 mg | INTRAVENOUS | Status: DC | PRN
  Administered 2020-04-16 (×4): 2 mg via INTRAVENOUS
  Filled 2020-04-16 (×5): qty 1

## 2020-04-16 MED ORDER — CEFAZOLIN SODIUM-DEXTROSE 2-4 GM/100ML-% IV SOLN
2.0000 g | INTRAVENOUS | Status: AC
  Administered 2020-04-17: 16:00:00 2 g via INTRAVENOUS
  Filled 2020-04-16: qty 100

## 2020-04-16 MED ORDER — SENNOSIDES-DOCUSATE SODIUM 8.6-50 MG PO TABS
1.0000 | ORAL_TABLET | Freq: Every day | ORAL | Status: DC
  Administered 2020-04-16 – 2020-04-18 (×3): 1 via ORAL
  Filled 2020-04-16 (×3): qty 1

## 2020-04-16 MED ORDER — ESCITALOPRAM OXALATE 10 MG PO TABS
10.0000 mg | ORAL_TABLET | Freq: Every day | ORAL | Status: DC
  Administered 2020-04-18 – 2020-04-19 (×2): 10 mg via ORAL
  Filled 2020-04-16 (×4): qty 1

## 2020-04-16 MED ORDER — SENNOSIDES-DOCUSATE SODIUM 8.6-50 MG PO TABS: 1.0000 | ORAL_TABLET | Freq: Every evening | ORAL | Status: DC | PRN

## 2020-04-16 MED ORDER — TRANEXAMIC ACID 1000 MG/10ML IV SOLN
2000.0000 mg | INTRAVENOUS | Status: DC
  Filled 2020-04-16 (×2): qty 20

## 2020-04-16 MED ORDER — ALBUTEROL SULFATE HFA 108 (90 BASE) MCG/ACT IN AERS
2.0000 | INHALATION_SPRAY | Freq: Four times a day (QID) | RESPIRATORY_TRACT | Status: DC | PRN
  Filled 2020-04-16: qty 6.7

## 2020-04-16 MED ORDER — POVIDONE-IODINE 10 % EX SWAB: 2.0000 "application " | Freq: Once | CUTANEOUS | Status: DC

## 2020-04-16 MED ORDER — TAMSULOSIN HCL 0.4 MG PO CAPS
0.4000 mg | ORAL_CAPSULE | Freq: Every day | ORAL | Status: DC
  Administered 2020-04-18 – 2020-04-19 (×2): 0.4 mg via ORAL
  Filled 2020-04-16 (×4): qty 1

## 2020-04-16 MED ORDER — LACTATED RINGERS IV SOLN: INTRAVENOUS | Status: AC

## 2020-04-16 MED ORDER — ALBUTEROL SULFATE (2.5 MG/3ML) 0.083% IN NEBU: 2.5000 mg | INHALATION_SOLUTION | Freq: Four times a day (QID) | RESPIRATORY_TRACT | Status: DC | PRN

## 2020-04-16 MED ORDER — IPRATROPIUM-ALBUTEROL 0.5-2.5 (3) MG/3ML IN SOLN
3.0000 mL | Freq: Three times a day (TID) | RESPIRATORY_TRACT | Status: DC
  Administered 2020-04-16: 3 mL via RESPIRATORY_TRACT
  Filled 2020-04-16 (×2): qty 3

## 2020-04-16 MED ORDER — ATORVASTATIN CALCIUM 10 MG PO TABS
10.0000 mg | ORAL_TABLET | Freq: Every day | ORAL | Status: DC
  Administered 2020-04-16 – 2020-04-18 (×2): 10 mg via ORAL
  Filled 2020-04-16 (×2): qty 1

## 2020-04-16 MED ORDER — MELATONIN 3 MG PO TABS
3.0000 mg | ORAL_TABLET | Freq: Every day | ORAL | Status: DC
  Administered 2020-04-16 – 2020-04-18 (×3): 3 mg via ORAL
  Filled 2020-04-16 (×3): qty 1

## 2020-04-16 NOTE — H&P (Signed)
History and Physical    Brandon Young. JSE:[id number] DOB: November 11, 1941 DOA: 04/15/2020  PCP: Linus Mako, NP   Patient coming from: SNF   Chief Complaint: Fall   HPI: Brandon Young. is a 79 y.o. male with medical history significant for COPD, Parkinson disease with dementia and behavioral disturbance, and history of peptic ulcer, now presenting to the emergency department after a fall at his SNF.  Patient is unable to provide any history, is reportedly ambulatory but does not speak at baseline, fell from a standing position at the SNF, and appeared to be experiencing left hip pain.  Family cannot be reached despite multiple attempts.  ED Course: Upon arrival to the ED, patient is found to be afebrile, saturating mid 07P, and with systolic blood pressure of 110.  EKG features a sinus rhythm and chest x-ray with very mild chronic appearing increased lung markings and very mild atelectasis on the right.  Radiographs of the hip demonstrate minimally displaced fracture of the left femoral neck.  Chemistry panel is unremarkable and CBC notable for a normocytic anemia with hemoglobin 10.9.  Orthopedic surgery was consulted by the ED physician and recommended medical admission to [facility].  Patient was treated with morphine, IV fluids, and Covid screening test was ordered.  Review of Systems:  Unable to complete ROS secondary the patient's clinical condition.  Past Medical History:  Diagnosis Date  . Acute bronchitis 04/14/2013  . Asthma    childhood- age 46  . COPD (chronic obstructive pulmonary disease) ([place]) 05/02/2013  . Dementia ([place])   . Elevated PSA 08/22/2013  . History of blood transfusion 1988  . History of chicken pox    childhood  . Medicare annual wellness visit, subsequent 10/01/2015  . Other and unspecified hyperlipidemia 08/22/2013  . Preventative health care 10/11/2016  . Ulcer of abdomen wall ([place]) 1988  . 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 ABDOMINAL SURGERY  1988   bleed ulcer  . HERNIA REPAIR  2013  . TONSILLECTOMY       reports that he has quit smoking. He has never used smokeless tobacco. He reports current alcohol use. He reports that he does not use drugs.  No Known Allergies  Family History  Problem Relation Age of Onset  . Hypertension Mother   . Dementia Mother   . Other Father        GI infection/disturbance  . Tremor Father   . COPD Sister        smoker  . Tremor Sister   . Tremor Brother   . Tremor Sister   . Colon cancer Neg Hx   . Heart disease Neg Hx   . Prostate cancer Neg Hx   . Breast cancer Neg Hx   . Diabetes Neg Hx      Prior to Admission medications   Medication Sig Start Date End Date Taking? Authorizing Provider  acetaminophen (TYLENOL) 325 MG tablet Take 650 mg by mouth every 4 (four) hours as needed for mild pain.   Yes [provider]  aspirin 81 MG tablet Take 81 mg by mouth daily.   Yes [provider]  atorvastatin (LIPITOR) 10 MG tablet TAKE 1 TABLET BY MOUTH ONCE DAILY Patient taking differently: Take 10 mg by mouth daily.  11/07/17  Yes Mosie Lukes, MD  carbidopa-levodopa (SINEMET IR) 25-100 MG per tablet Take 2 tablets by mouth in the morning and  at bedtime.    Yes [provider]  donepezil (ARICEPT) 10 MG tablet TAKE 1 TABLET BY MOUTH AT BEDTIME Patient taking differently: Take 10 mg by mouth at bedtime.  12/18/15  Yes Penumalli, Vikram R, MD  escitalopram (LEXAPRO) 10 MG tablet TAKE 1 TABLET EVERY DAY Patient taking differently: Take 10 mg by mouth daily.  10/19/14  Yes Ronal Fear, NP  furosemide (LASIX) 20 MG tablet Take 10 mg by mouth See admin instructions. Every two days starting 01/04/20   Yes [provider]  ipratropium-albuterol (DUONEB) 0.5-2.5 (3) MG/3ML SOLN Take 3 mLs by nebulization in the morning, at noon, and at bedtime.   Yes [provider]  loratadine  (CLARITIN) 10 MG tablet Take 10 mg by mouth daily.   Yes [provider]  melatonin 3 MG TABS tablet Take 3 mg by mouth at bedtime.   Yes [provider]  Multiple Vitamin (MULTIVITAMIN WITH MINERALS) TABS tablet Take 1 tablet by mouth daily.   Yes [provider]  omeprazole (PRILOSEC) 20 MG capsule Take 20 mg by mouth 2 (two) times daily before a meal.   Yes [provider]  ondansetron (ZOFRAN-ODT) 4 MG disintegrating tablet Take 1 tablet (4 mg total) by mouth every 8 (eight) hours as needed for nausea. 06/22/14  Yes Penumalli, Glenford Bayley, MD  primidone (MYSOLINE) 50 MG tablet Take 100 mg by mouth daily. Tapering scale 10/19/14  Yes [provider]  risperiDONE (RISPERDAL) 0.25 MG tablet Take 0.125 mg by mouth 2 (two) times daily.   Yes [provider]  tamsulosin (FLOMAX) 0.4 MG CAPS capsule Take 0.4 mg by mouth daily.   Yes [provider]  vitamin C (ASCORBIC ACID) 250 MG tablet Take 500 mg by mouth daily.   Yes [provider]  Vitamin D, Ergocalciferol, (DRISDOL) 1.25 MG (50000 UNIT) CAPS capsule Take 50,000 Units by mouth every 7 (seven) days.   Yes [provider]  zinc gluconate 50 MG tablet Take 50 mg by mouth daily.   Yes [provider]  albuterol (PROVENTIL HFA;VENTOLIN HFA) 108 (90 BASE) MCG/ACT inhaler Inhale 2 puffs into the lungs every 6 (six) hours as needed for wheezing or shortness of breath. Patient not taking: Reported on 04/15/2020 04/30/13   Bradd Canary, MD    Physical Exam: Vitals:   04/15/20 2136 04/15/20 2240 04/16/20 0000  BP: (!) 167/127 (!) 188/110 (!) 110/52  Pulse: 66 65 77  Resp: 18 18   Temp: 97.7 F (36.5 C)    TempSrc: Oral    SpO2: 96% 95% 96%    Constitutional: NAD, calm  Eyes: PERTLA, lids and conjunctivae normal ENMT: Mucous membranes are moist. Posterior pharynx clear of any exudate or lesions.   Neck: normal, supple, no masses, no thyromegaly Respiratory:  no wheezing, no crackles. No accessory muscle use.  Cardiovascular: S1 & S2 heard, regular rate and rhythm. No extremity edema.   Abdomen: No distension, no tenderness, soft. Bowel sounds active.  Musculoskeletal: no clubbing / cyanosis. No joint deformity upper and lower extremities.   Skin: no significant rashes, lesions, ulcers. Warm, dry, well-perfused. Neurologic: no gross facial asymmetry, PERRL. Sensation intact. Moving all extremities. Resting tremor involving all extremities Psychiatric: Awake, calm, not speaking or following commands.     Labs and Imaging on Admission: I have personally reviewed following labs and imaging studies  CBC: Recent Labs  Lab 04/15/20 2315  WBC 8.8  NEUTROABS 7.5  HGB 10.9*  HCT  35.3*  MCV 96.2  PLT 189   Basic Metabolic Panel: Recent Labs  Lab 04/15/20 2315  NA 138  K 4.1  CL 104  CO2 26  GLUCOSE 132*  BUN 20  CREATININE 0.80  CALCIUM 8.0*   GFR: CrCl cannot be calculated (Unknown ideal weight.). Liver Function Tests: No results for input(s): AST, ALT, ALKPHOS, BILITOT, PROT, ALBUMIN in the last 168 hours. No results for input(s): LIPASE, AMYLASE in the last 168 hours. No results for input(s): AMMONIA in the last 168 hours. Coagulation Profile: Recent Labs  Lab 04/15/20 2315  INR 1.2   Cardiac Enzymes: No results for input(s): CKTOTAL, CKMB, CKMBINDEX, TROPONINI in the last 168 hours. BNP (last 3 results) No results for input(s): PROBNP in the last 8760 hours. HbA1C: No results for input(s): HGBA1C in the last 72 hours. CBG: No results for input(s): GLUCAP in the last 168 hours. Lipid Profile: No results for input(s): CHOL, HDL, LDLCALC, TRIG, CHOLHDL, LDLDIRECT in the last 72 hours. Thyroid Function Tests: No results for input(s): TSH, T4TOTAL, FREET4, T3FREE, THYROIDAB in the last 72 hours. Anemia Panel: No results for input(s): VITAMINB12, FOLATE, FERRITIN, TIBC, IRON, RETICCTPCT in the last 72 hours. Urine  analysis:    Component Value Date/Time   COLORURINE YELLOW 10/27/2017 0210   APPEARANCEUR CLEAR 10/27/2017 0210   LABSPEC >1.030 (H) 10/27/2017 0210   PHURINE 5.5 10/27/2017 0210   GLUCOSEU NEGATIVE 10/27/2017 0210   HGBUR NEGATIVE 10/27/2017 0210   BILIRUBINUR NEGATIVE 10/27/2017 0210   KETONESUR 15 (A) 10/27/2017 0210   PROTEINUR NEGATIVE 10/27/2017 0210   UROBILINOGEN 0.2 08/17/2013 1056   NITRITE NEGATIVE 10/27/2017 0210   LEUKOCYTESUR NEGATIVE 10/27/2017 0210   Sepsis Labs: @LABRCNTIP (procalcitonin:4,lacticidven:4) )No results found for this or any previous visit (from the past 240 hour(s)).   Radiological Exams on Admission: DG Chest Port 1 View  Result Date: 04/16/2020 CLINICAL DATA:  Preoperative evaluation prior to fractured hip repair. EXAM: PORTABLE CHEST 1 VIEW COMPARISON:  April 12, 2013 FINDINGS: Very mild, chronic appearing increased lung markings are seen. Very mild atelectasis is seen within the mid right lung. There is no evidence of a pleural effusion or pneumothorax. The heart size and mediastinal contours are within normal limits. Radiopaque surgical clips are seen overlying the expected region of the esophageal hiatus. The visualized skeletal structures are unremarkable. IMPRESSION: Very mild, chronic-appearing increased lung markings with very mild mid right lung atelectasis. Electronically Signed   By: April 14, 2013 M.D.   On: 04/16/2020 01:13   DG Hip Unilat W or Wo Pelvis 2-3 Views Left  Result Date: 04/15/2020 CLINICAL DATA:  79 year old male with fall and left hip pain. EXAM: DG HIP (WITH OR WITHOUT PELVIS) 2-3V LEFT COMPARISON:  None. FINDINGS: There is a minimally displaced basicervical or intertrochanteric fracture of the left femoral neck. No dislocation. The bones are osteopenic. The soft tissues are unremarkable. IMPRESSION: Minimally displaced fracture of the left femoral neck. Electronically Signed   By: 75 M.D.   On: 04/15/2020 22:21      EKG: Sinus rhythm, RAD.   Assessment/Plan   1. Left hip fracture  - Presents after a fall at his SNF and is found to have a left femoral neck fracture  - Orthopedic surgery consulting and much appreciated  - Based on the available data, Brandon Young presents an estimated 1.5% risk of perioperative MI or cardiac arrest  - Continue pain-control, NPO, CMS checks, gentle IVF hydration, hold ASA   2. Parkinson disease  with dementia  - Patient is reportedly ambulatory at baseline but has not been communicating  - Continue Sinemet, Lexapro, Risperdal    3. COPD  - No cough or wheeze on admission  - Continue breathing treatments     DVT prophylaxis: SCDs Code Status: Full, patient not speaking and family could not be reached  Family Communication: Unable to reach family at time of admission  Disposition Plan:  Patient is from: SNF  Anticipated d/c is to: SNF  Anticipated d/c date is: 04/19/20  Patient currently: Pending surgical consultation  Consults called: Orthopedic surgery  Admission status: Inpatient     Briscoe Deutscher, MD [facility] Pager: See [email or web address]  If 7AM-7PM, please contact the daytime attending [email or web address]  04/16/2020, 1:41 AM

## 2020-04-16 NOTE — Progress Notes (Addendum)
Patient seen and examined.  HPI reviewed.  Agree with assessment and plan.  Plan to transfer to [facility] for possible surgery. Patient is tremorous, nonverbal.  1-left hip fracture: Continue pain control, n.p.o. status. Transfer to [facility] for possible surgery.  2-Parkinson's disease with dementia: Continue with Sinemet, Lexapro, Risperdal Nurse to find out timing for surgery to see if patient can receive his Sinemet for Parkinson. As needed low-dose Ativan ordered for severe tremors.  3-COPD: Stable Nurse will try to continue to contact family.. Son was at Bedside, discussed care with him, goal for surgery is to help with pain. Explain to him risk of worsening delirium, parkinson symptoms. Will let patient eat today. Will start with soft diet. Speech swallow eval. Patient is DNR>   Hartley Barefoot, MD.

## 2020-04-16 NOTE — Progress Notes (Signed)
I have posted patient for surgical repair of left hip tomorrow.  He can have a diet today.  I have left voicemail with his wife to discuss surgical treatment.  Mayra Reel, MD [facility] [phone] 9:03 AM

## 2020-04-16 NOTE — ED Notes (Signed)
Called [facility] for report, RN will have to call back. Charge also no available. Will call me back

## 2020-04-16 NOTE — Plan of Care (Signed)
  Problem: Education: Goal: Knowledge of General Education information will improve Description Including pain rating scale, medication(s)/side effects and non-pharmacologic comfort measures Outcome: Progressing   

## 2020-04-16 NOTE — ED Provider Notes (Signed)
Nursing notes and vitals signs, including pulse oximetry, reviewed.  Summary of this visit's results, reviewed by myself:  EKG: EKG Interpretation:  Date & Time: 04/15/2020 23:01 PM  Rate: 70  Rhythm: normal sinus rhythm  QRS Axis: right  Intervals: normal  ST/T Wave abnormalities: normal  Conduction Disutrbances:none  Narrative Interpretation:   Old EKG Reviewed: none available   Labs:  Results for orders placed or performed during the hospital encounter of 04/15/20 (from the past 24 hour(s))  Basic metabolic panel     Status: Abnormal   Collection Time: 04/15/20 11:15 PM  Result Value Ref Range   Sodium 138 135 - 145 mmol/L   Potassium 4.1 3.5 - 5.1 mmol/L   Chloride 104 98 - 111 mmol/L   CO2 26 22 - 32 mmol/L   Glucose, Bld 132 (H) 70 - 99 mg/dL   BUN 20 8 - 23 mg/dL   Creatinine, Ser 2.84 0.61 - 1.24 mg/dL   Calcium 8.0 (L) 8.9 - 10.3 mg/dL   GFR calc non Af Amer >60 >60 mL/min   GFR calc Af Amer >60 >60 mL/min   Anion gap 8 5 - 15  CBC WITH DIFFERENTIAL     Status: Abnormal   Collection Time: 04/15/20 11:15 PM  Result Value Ref Range   WBC 8.8 4.0 - 10.5 K/uL   RBC 3.67 (L) 4.22 - 5.81 MIL/uL   Hemoglobin 10.9 (L) 13.0 - 17.0 g/dL   HCT 13.2 (L) 44.0 - 10.2 %   MCV 96.2 80.0 - 100.0 fL   MCH 29.7 26.0 - 34.0 pg   MCHC 30.9 30.0 - 36.0 g/dL   RDW 72.5 36.6 - 44.0 %   Platelets 189 150 - 400 K/uL   nRBC 0.0 0.0 - 0.2 %   Neutrophils Relative % 86 %   Neutro Abs 7.5 1.7 - 7.7 K/uL   Lymphocytes Relative 7 %   Lymphs Abs 0.6 (L) 0.7 - 4.0 K/uL   Monocytes Relative 7 %   Monocytes Absolute 0.6 0.1 - 1.0 K/uL   Eosinophils Relative 0 %   Eosinophils Absolute 0.0 0.0 - 0.5 K/uL   Basophils Relative 0 %   Basophils Absolute 0.0 0.0 - 0.1 K/uL   Immature Granulocytes 0 %   Abs Immature Granulocytes 0.03 0.00 - 0.07 K/uL  Protime-INR     Status: None   Collection Time: 04/15/20 11:15 PM  Result Value Ref Range   Prothrombin Time 15.1 11.4 - 15.2 seconds   INR  1.2 0.8 - 1.2    Imaging Studies: DG Hip Unilat W or Wo Pelvis 2-3 Views Left  Result Date: 04/15/2020 CLINICAL DATA:  79 year old male with fall and left hip pain. EXAM: DG HIP (WITH OR WITHOUT PELVIS) 2-3V LEFT COMPARISON:  None. FINDINGS: There is a minimally displaced basicervical or intertrochanteric fracture of the left femoral neck. No dislocation. The bones are osteopenic. The soft tissues are unremarkable. IMPRESSION: Minimally displaced fracture of the left femoral neck. Electronically Signed   By: Elgie Collard M.D.   On: 04/15/2020 22:21      Jalysa Swopes, Jonny Ruiz, MD 04/16/20 [phone]

## 2020-04-16 NOTE — Evaluation (Signed)
Clinical/Bedside Swallow Evaluation Patient Details  Name: Brandon Young. MRN: [id number] Date of Birth: 08-Jun-1941  Today's Date: 04/16/2020 Time: SLP Start Time (ACUTE ONLY): 1332 SLP Stop Time (ACUTE ONLY): 1347 SLP Time Calculation (min) (ACUTE ONLY): 15 min  Past Medical History:  Past Medical History:  Diagnosis Date  . Acute bronchitis 04/14/2013  . Asthma    childhood- age 79  . COPD (chronic obstructive pulmonary disease) ([place]) 05/02/2013  . Dementia ([place])   . Elevated PSA 08/22/2013  . History of blood transfusion 1988  . History of chicken pox    childhood  . Medicare annual wellness visit, subsequent 10/01/2015  . Other and unspecified hyperlipidemia 08/22/2013  . Preventative health care 10/11/2016  . Ulcer of abdomen wall ([place]) 1988  . 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 ABDOMINAL SURGERY  1988   bleed ulcer  . HERNIA REPAIR  2013  . TONSILLECTOMY     HPI:  Brandon Young. is a 79 y.o. 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 Sustained left femoral neck fracture.   Surgery planned for 4/26.  MBS 08/20/18 revealed mild oral dysphagia; regular solids/thin liquids were recommended at that time.   Assessment / Plan / Recommendation Clinical Impression  Pt participated in limited clinical swallow evaluation.  He was lethargic, required tactile cues, cool/wet washcloth in effort to arouse sufficiently for assessment.  Once awake, he required tactile priming in order for him to recognize/demonstrate appropriate motor response in reaction to a spoon, straw, or side of the cup. After several attempts offering water from a spoon, pt was able to functionally manipulate the spoon, accept the water, and initiate a swallow response - he  had great difficulty shifting motor pattern for applesauce.  He was able to drink some water from a straw with intermittent throat clearing.  Pt maintained eyes closed for duration of session; did not follow commands. He spoke intermittently, it was generally unintelligible.  He had significant tremor in RUE.  Recommend for today allow dysphagia 1/thin liquids - full assistance with feeding and only when alert.     At minimum, encourage sips and chips and continue oral care to maintain healthy oral mucosa during this period immediately pre- and post-surgery. SLP will f/u after surgery to ensure safe PO intake and advance diet as indicated. D/W RN.   SLP Visit Diagnosis: Dysphagia, unspecified (R13.10)    Aspiration Risk       Diet Recommendation   dysphagia 1, thin liquids  Medication Administration: Crushed with puree    Other  Recommendations Oral Care Recommendations: Oral care BID   Follow up Recommendations        Frequency and Duration min 2x/week  2 weeks       Prognosis Prognosis for Safe Diet Advancement: Fair      Swallow Study   General HPI: Brandon Young. is a 79 y.o. 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 Sustained left femoral neck fracture.   Surgery planned for 4/26.  MBS 08/20/18 revealed mild oral dysphagia; regular solids/thin liquids were recommended at that time. Type of Study: Bedside Swallow Evaluation Previous Swallow Assessment:  MBS 08/20/18 revealed mild oral dysphagia; regular solids/thin liquids were recommended at that time. Diet Prior to this Study: Dysphagia 3 (soft);Thin liquids Temperature Spikes Noted: No Respiratory Status: Room air History of Recent Intubation: No Behavior/Cognition: Lethargic/Drowsy Oral Cavity Assessment: Within Functional Limits Oral Cavity - Dentition:  Adequate natural dentition Self-Feeding Abilities: Total assist Patient Positioning: Upright in bed Baseline Vocal Quality: Normal Volitional Cough: Cognitively unable to elicit Volitional Swallow: Unable to elicit    Oral/Motor/Sensory Function Overall Oral Motor/Sensory Function: Other (comment)(does not follow commands; symmetric at rest)   Ice Chips Ice chips: Within functional limits   Thin Liquid Thin Liquid: Impaired Presentation: Cup;Spoon;Straw Oral Phase Impairments: Poor awareness of bolus Pharyngeal  Phase Impairments: Throat Clearing - Delayed    Nectar Thick Nectar Thick Liquid: Not tested   Honey Thick Honey Thick Liquid: Not tested   Puree Puree: Impaired Presentation: Spoon Oral Phase Impairments: Poor awareness of bolus Oral Phase Functional Implications: Prolonged oral transit   Solid     Solid: Not tested     Hiawatha Dressel L. [name], MA CCC/SLP Acute Rehabilitation Services Office number [phone] Pager [phone]  [name] 04/16/2020,2:00 PM

## 2020-04-17 ENCOUNTER — Inpatient Hospital Stay (HOSPITAL_COMMUNITY): Payer: Medicare PPO

## 2020-04-17 ENCOUNTER — Encounter (HOSPITAL_COMMUNITY): Payer: Self-pay | Admitting: Family Medicine

## 2020-04-17 ENCOUNTER — Encounter (HOSPITAL_COMMUNITY)
Admission: EM | Disposition: A | Discharge status: Skilled Nursing Facility | Payer: Self-pay | Source: Home / Self Care | Attending: Internal Medicine

## 2020-04-17 DIAGNOSIS — F0281 Dementia in other diseases classified elsewhere with behavioral disturbance: Secondary | ICD-10-CM | POA: Diagnosis not present

## 2020-04-17 DIAGNOSIS — G20 Parkinson's disease: Secondary | ICD-10-CM | POA: Diagnosis not present

## 2020-04-17 DIAGNOSIS — J418 Mixed simple and mucopurulent chronic bronchitis: Secondary | ICD-10-CM | POA: Diagnosis not present

## 2020-04-17 DIAGNOSIS — F0280 Dementia in other diseases classified elsewhere without behavioral disturbance: Secondary | ICD-10-CM

## 2020-04-17 DIAGNOSIS — S72142A Displaced intertrochanteric fracture of left femur, initial encounter for closed fracture: Principal | ICD-10-CM

## 2020-04-17 HISTORY — PX: INTRAMEDULLARY (IM) NAIL INTERTROCHANTERIC: SHX5875

## 2020-04-17 LAB — CBC
HCT: 33 % — ABNORMAL LOW (ref 39.0–52.0)
Hemoglobin: 10 g/dL — ABNORMAL LOW (ref 13.0–17.0)
MCH: 28.8 pg (ref 26.0–34.0)
MCHC: 30.3 g/dL (ref 30.0–36.0)
MCV: 95.1 fL (ref 80.0–100.0)
Platelets: 154 10*3/uL (ref 150–400)
RBC: 3.47 MIL/uL — ABNORMAL LOW (ref 4.22–5.81)
RDW: 14.4 % (ref 11.5–15.5)
WBC: 8.9 10*3/uL (ref 4.0–10.5)
nRBC: 0 % (ref 0.0–0.2)

## 2020-04-17 LAB — MRSA PCR SCREENING: MRSA by PCR: NEGATIVE

## 2020-04-17 LAB — CREATININE, SERUM
Creatinine, Ser: 0.64 mg/dL (ref 0.61–1.24)
GFR calc Af Amer: >60 mL/min (ref >60)
GFR calc non Af Amer: >60 mL/min (ref >60)

## 2020-04-17 SURGERY — FIXATION, FRACTURE, INTERTROCHANTERIC, WITH INTRAMEDULLARY ROD
Anesthesia: Spinal | Site: Hip | Laterality: Left

## 2020-04-17 MED ORDER — FENTANYL CITRATE (PF) 100 MCG/2ML IJ SOLN: 25.0000 ug | INTRAMUSCULAR | Status: DC | PRN

## 2020-04-17 MED ORDER — ONDANSETRON HCL 4 MG PO TABS: 4.0000 mg | ORAL_TABLET | Freq: Four times a day (QID) | ORAL | Status: DC | PRN

## 2020-04-17 MED ORDER — SORBITOL 70 % SOLN: 30.0000 mL | Freq: Every day | Status: DC | PRN

## 2020-04-17 MED ORDER — FENTANYL CITRATE (PF) 250 MCG/5ML IJ SOLN
INTRAMUSCULAR | Status: DC | PRN
  Administered 2020-04-17: 25 ug via INTRAVENOUS

## 2020-04-17 MED ORDER — METHOCARBAMOL 500 MG PO TABS: 500.0000 mg | ORAL_TABLET | Freq: Four times a day (QID) | ORAL | Status: DC | PRN

## 2020-04-17 MED ORDER — ONDANSETRON HCL 4 MG/2ML IJ SOLN: 4.0000 mg | Freq: Four times a day (QID) | INTRAMUSCULAR | Status: DC | PRN

## 2020-04-17 MED ORDER — PHENOL 1.4 % MT LIQD: 1.0000 | OROMUCOSAL | Status: DC | PRN

## 2020-04-17 MED ORDER — 0.9 % SODIUM CHLORIDE (POUR BTL) OPTIME
TOPICAL | Status: DC | PRN
  Administered 2020-04-17: 1000 mL

## 2020-04-17 MED ORDER — PHENYLEPHRINE 40 MCG/ML (10ML) SYRINGE FOR IV PUSH (FOR BLOOD PRESSURE SUPPORT)
PREFILLED_SYRINGE | INTRAVENOUS | Status: AC
  Filled 2020-04-17: qty 10

## 2020-04-17 MED ORDER — MORPHINE SULFATE (PF) 2 MG/ML IV SOLN: 1.0000 mg | INTRAVENOUS | Status: DC | PRN

## 2020-04-17 MED ORDER — METHOCARBAMOL 1000 MG/10ML IJ SOLN
500.0000 mg | Freq: Four times a day (QID) | INTRAVENOUS | Status: DC | PRN
  Filled 2020-04-17: qty 5

## 2020-04-17 MED ORDER — SODIUM CHLORIDE 0.9 % IV SOLN: INTRAVENOUS | Status: DC

## 2020-04-17 MED ORDER — ALUM & MAG HYDROXIDE-SIMETH 200-200-20 MG/5ML PO SUSP: 30.0000 mL | ORAL | Status: DC | PRN

## 2020-04-17 MED ORDER — LACTATED RINGERS IV SOLN: INTRAVENOUS | Status: DC

## 2020-04-17 MED ORDER — PROPOFOL 500 MG/50ML IV EMUL
INTRAVENOUS | Status: DC | PRN
  Administered 2020-04-17: 100 ug/kg/min via INTRAVENOUS

## 2020-04-17 MED ORDER — TRANEXAMIC ACID 1000 MG/10ML IV SOLN
2000.0000 mg | INTRAVENOUS | Status: AC
  Filled 2020-04-17: qty 20

## 2020-04-17 MED ORDER — LIDOCAINE HCL (CARDIAC) PF 100 MG/5ML IV SOSY
PREFILLED_SYRINGE | INTRAVENOUS | Status: DC | PRN
  Administered 2020-04-17: 100 mg via INTRATRACHEAL

## 2020-04-17 MED ORDER — LACTATED RINGERS IV SOLN: INTRAVENOUS | Status: DC | PRN

## 2020-04-17 MED ORDER — ENOXAPARIN SODIUM 40 MG/0.4ML ~~LOC~~ SOLN: 40.0000 mg | Freq: Every day | SUBCUTANEOUS | 13 refills | Status: AC

## 2020-04-17 MED ORDER — ACETAMINOPHEN 325 MG PO TABS
325.0000 mg | ORAL_TABLET | Freq: Four times a day (QID) | ORAL | Status: DC | PRN
  Administered 2020-04-19: 10:00:00 650 mg via ORAL
  Filled 2020-04-17: qty 2

## 2020-04-17 MED ORDER — HYDROCODONE-ACETAMINOPHEN 5-325 MG PO TABS: 1.0000 | ORAL_TABLET | Freq: Three times a day (TID) | ORAL | 0 refills | Status: AC | PRN

## 2020-04-17 MED ORDER — HYDROCODONE-ACETAMINOPHEN 5-325 MG PO TABS: 1.0000 | ORAL_TABLET | ORAL | Status: DC | PRN

## 2020-04-17 MED ORDER — EPHEDRINE SULFATE-NACL 50-0.9 MG/10ML-% IV SOSY
PREFILLED_SYRINGE | INTRAVENOUS | Status: DC | PRN
  Administered 2020-04-17: 15 mg via INTRAVENOUS

## 2020-04-17 MED ORDER — ACETAMINOPHEN 10 MG/ML IV SOLN
INTRAVENOUS | Status: AC
  Filled 2020-04-17: qty 100

## 2020-04-17 MED ORDER — LIDOCAINE 2% (20 MG/ML) 5 ML SYRINGE
INTRAMUSCULAR | Status: AC
  Filled 2020-04-17: qty 5

## 2020-04-17 MED ORDER — MIDAZOLAM HCL 2 MG/2ML IJ SOLN
INTRAMUSCULAR | Status: AC
  Filled 2020-04-17: qty 2

## 2020-04-17 MED ORDER — HYDROCODONE-ACETAMINOPHEN 7.5-325 MG PO TABS
1.0000 | ORAL_TABLET | ORAL | Status: DC | PRN
  Administered 2020-04-19: 1 via ORAL
  Filled 2020-04-17: qty 1

## 2020-04-17 MED ORDER — ONDANSETRON HCL 4 MG/2ML IJ SOLN
INTRAMUSCULAR | Status: DC | PRN
  Administered 2020-04-17: 4 mg via INTRAVENOUS

## 2020-04-17 MED ORDER — BUPIVACAINE IN DEXTROSE 0.75-8.25 % IT SOLN
INTRATHECAL | Status: DC | PRN
Start: 2020-04-17 — End: 2020-04-17
  Administered 2020-04-17: 1.8 mL via INTRATHECAL

## 2020-04-17 MED ORDER — PROMETHAZINE HCL 25 MG/ML IJ SOLN: 6.2500 mg | INTRAMUSCULAR | Status: DC | PRN

## 2020-04-17 MED ORDER — ENOXAPARIN SODIUM 40 MG/0.4ML ~~LOC~~ SOLN
40.0000 mg | SUBCUTANEOUS | Status: DC
  Administered 2020-04-18 – 2020-04-19 (×2): 40 mg via SUBCUTANEOUS
  Filled 2020-04-17 (×2): qty 0.4

## 2020-04-17 MED ORDER — PROPOFOL 10 MG/ML IV BOLUS
INTRAVENOUS | Status: DC | PRN
  Administered 2020-04-17: 50 mg via INTRAVENOUS

## 2020-04-17 MED ORDER — FENTANYL CITRATE (PF) 250 MCG/5ML IJ SOLN
INTRAMUSCULAR | Status: AC
  Filled 2020-04-17: qty 5

## 2020-04-17 MED ORDER — MENTHOL 3 MG MT LOZG: 1.0000 | LOZENGE | OROMUCOSAL | Status: DC | PRN

## 2020-04-17 MED ORDER — ACETAMINOPHEN 500 MG PO TABS
500.0000 mg | ORAL_TABLET | Freq: Four times a day (QID) | ORAL | Status: AC
  Administered 2020-04-17 – 2020-04-18 (×4): 500 mg via ORAL
  Filled 2020-04-17 (×4): qty 1

## 2020-04-17 MED ORDER — MAGNESIUM CITRATE PO SOLN: 1.0000 | Freq: Once | ORAL | Status: DC | PRN

## 2020-04-17 MED ORDER — POLYETHYLENE GLYCOL 3350 17 G PO PACK: 17.0000 g | PACK | Freq: Every day | ORAL | Status: DC | PRN

## 2020-04-17 MED ORDER — PHENYLEPHRINE HCL-NACL 10-0.9 MG/250ML-% IV SOLN
INTRAVENOUS | Status: DC | PRN
  Administered 2020-04-17: 25 ug/min via INTRAVENOUS

## 2020-04-17 MED ORDER — DOCUSATE SODIUM 100 MG PO CAPS
100.0000 mg | ORAL_CAPSULE | Freq: Two times a day (BID) | ORAL | Status: DC
  Administered 2020-04-17 – 2020-04-19 (×4): 100 mg via ORAL
  Filled 2020-04-17 (×4): qty 1

## 2020-04-17 MED ORDER — CEFAZOLIN SODIUM-DEXTROSE 2-4 GM/100ML-% IV SOLN
2.0000 g | Freq: Four times a day (QID) | INTRAVENOUS | Status: AC
  Administered 2020-04-17 – 2020-04-18 (×3): 2 g via INTRAVENOUS
  Filled 2020-04-17 (×3): qty 100

## 2020-04-17 MED ORDER — ACETAMINOPHEN 10 MG/ML IV SOLN
INTRAVENOUS | Status: DC | PRN
  Administered 2020-04-17: 1000 mg via INTRAVENOUS

## 2020-04-17 SURGICAL SUPPLY — 39 items
BNDG COHESIVE 4X5 TAN STRL (GAUZE/BANDAGES/DRESSINGS) ×3 IMPLANT
COVER PERINEAL POST (MISCELLANEOUS) ×3 IMPLANT
COVER SURGICAL LIGHT HANDLE (MISCELLANEOUS) ×3 IMPLANT
COVER WAND RF STERILE (DRAPES) ×3 IMPLANT
DRAPE STERI IOBAN 125X83 (DRAPES) ×3 IMPLANT
DRSG MEPILEX BORDER 4X4 (GAUZE/BANDAGES/DRESSINGS) ×6 IMPLANT
DRSG MEPILEX BORDER 4X8 (GAUZE/BANDAGES/DRESSINGS) IMPLANT
DRSG PAD ABDOMINAL 8X10 ST (GAUZE/BANDAGES/DRESSINGS) IMPLANT
DURAPREP 26ML APPLICATOR (WOUND CARE) ×3 IMPLANT
ELECT REM PT RETURN 9FT ADLT (ELECTROSURGICAL) ×3
ELECTRODE REM PT RTRN 9FT ADLT (ELECTROSURGICAL) ×1 IMPLANT
GLOVE BIOGEL PI IND STRL 7.0 (GLOVE) ×1 IMPLANT
GLOVE BIOGEL PI INDICATOR 7.0 (GLOVE) ×2
GLOVE ECLIPSE 7.0 STRL STRAW (GLOVE) ×3 IMPLANT
GLOVE SKINSENSE NS SZ7.5 (GLOVE) ×4
GLOVE SKINSENSE STRL SZ7.5 (GLOVE) ×2 IMPLANT
GOWN STRL REIN XL XLG (GOWN DISPOSABLE) ×3 IMPLANT
GUIDE PIN 3.2X343 (PIN) ×1
GUIDE PIN 3.2X343MM (PIN) ×2
KIT BASIN OR (CUSTOM PROCEDURE TRAY) ×3 IMPLANT
KIT TURNOVER KIT B (KITS) ×3 IMPLANT
MANIFOLD NEPTUNE II (INSTRUMENTS) ×3 IMPLANT
NAIL TRIGEN 10MMX40CM-125 LEFT (Nail) ×3 IMPLANT
NS IRRIG 1000ML POUR BTL (IV SOLUTION) ×3 IMPLANT
PACK GENERAL/GYN (CUSTOM PROCEDURE TRAY) ×3 IMPLANT
PAD ARMBOARD 7.5X6 YLW CONV (MISCELLANEOUS) ×6 IMPLANT
PAD CAST 4YDX4 CTTN HI CHSV (CAST SUPPLIES) ×2 IMPLANT
PADDING CAST COTTON 4X4 STRL (CAST SUPPLIES) ×4
PIN GUIDE 3.2X343MM (PIN) ×1 IMPLANT
SCREW LAG COMPR KIT 90/85 (Screw) ×3 IMPLANT
STAPLER VISISTAT 35W (STAPLE) ×6 IMPLANT
SUT VIC AB 0 CT1 27 (SUTURE) ×2
SUT VIC AB 0 CT1 27XBRD ANBCTR (SUTURE) ×1 IMPLANT
SUT VIC AB 2-0 CT1 27 (SUTURE) ×6
SUT VIC AB 2-0 CT1 36 (SUTURE) ×3 IMPLANT
SUT VIC AB 2-0 CT1 TAPERPNT 27 (SUTURE) ×3 IMPLANT
TOWEL GREEN STERILE (TOWEL DISPOSABLE) ×3 IMPLANT
TOWEL GREEN STERILE FF (TOWEL DISPOSABLE) ×3 IMPLANT
WATER STERILE IRR 1000ML POUR (IV SOLUTION) ×3 IMPLANT

## 2020-04-17 NOTE — Progress Notes (Signed)
SLP Cancellation Note  Patient Details Name: Brandon Young. MRN: [id number] DOB: 01-Oct-1941   Cancelled treatment:       Reason Eval/Treat Not Completed: Patient NPO for surgery.  Will f/u next date.  [name] [name], MA CCC/SLP Acute Rehabilitation Services Office number [phone] Pager [phone]    [name] 04/17/2020, 8:51 AM

## 2020-04-17 NOTE — Progress Notes (Signed)
Patient in PACU during yellow mews report just received from [facility]

## 2020-04-17 NOTE — Anesthesia Preprocedure Evaluation (Addendum)
Anesthesia Evaluation  Patient identified by MRN, date of birth, ID band Patient unresponsive    Reviewed: Allergy & Precautions, NPO status , Patient's Chart, lab work & pertinent test results  History of Anesthesia Complications Negative for: history of anesthetic complications  Airway Mallampati: II  TM Distance: >3 FB Neck ROM: Full    Dental  (+) Caps, Dental Advisory Given   Pulmonary asthma , COPD, former smoker,    Pulmonary exam normal        Cardiovascular negative cardio ROS Normal cardiovascular exam     Neuro/Psych PSYCHIATRIC DISORDERS Dementia Parkinson's diseasenegative neurological ROS     GI/Hepatic Neg liver ROS, PUD,   Endo/Other  negative endocrine ROS  Renal/GU negative Renal ROS     Musculoskeletal negative musculoskeletal ROS (+)   Abdominal   Peds  Hematology negative hematology ROS (+)   Anesthesia Other Findings Day of surgery medications reviewed with the patient.  Reproductive/Obstetrics                            Anesthesia Physical Anesthesia Plan  ASA: III  Anesthesia Plan: Spinal   Post-op Pain Management:    Induction:   PONV Risk Score and Plan: 1 and Ondansetron and Propofol infusion  Airway Management Planned: Natural Airway  Additional Equipment:   Intra-op Plan:   Post-operative Plan:   Informed Consent: I have reviewed the patients History and Physical, chart, labs and discussed the procedure including the risks, benefits and alternatives for the proposed anesthesia with the patient or authorized representative who has indicated his/her understanding and acceptance.   Patient has DNR.  Suspend DNR and Discussed DNR with power of attorney.   Dental advisory given and Consent reviewed with POA  Plan Discussed with: Anesthesiologist and CRNA  Anesthesia Plan Comments:        Anesthesia Quick Evaluation

## 2020-04-17 NOTE — Plan of Care (Signed)
  Problem: Education: Goal: Knowledge of General Education information will improve Description Including pain rating scale, medication(s)/side effects and non-pharmacologic comfort measures Outcome: Progressing   

## 2020-04-17 NOTE — Anesthesia Postprocedure Evaluation (Signed)
Anesthesia Post Note  Patient: Brandon Young.  Procedure(s) Performed: INTRAMEDULLARY (IM) NAIL INTERTROCHANTRIC (Left Hip)     Patient location during evaluation: PACU Anesthesia Type: Spinal Level of consciousness: oriented and awake and alert Pain management: pain level controlled Vital Signs Assessment: post-procedure vital signs reviewed and stable Respiratory status: spontaneous breathing and respiratory function stable Cardiovascular status: blood pressure returned to baseline and stable Postop Assessment: no headache, no backache, no apparent nausea or vomiting and spinal receding Anesthetic complications: no    Last Vitals:  Vitals:   04/17/20 0437 04/17/20 1252  BP: 115/78 117/61  Pulse: 65 69  Resp: 17 14  Temp: 37.2 C 36.6 C  SpO2: 98% 96%    Last Pain:  Vitals:   04/17/20 1455  TempSrc:   PainSc: 0-No pain                 Lannie Fields

## 2020-04-17 NOTE — Progress Notes (Signed)
PROGRESS NOTE  Brandon Young. FAO:[id number] DOB: 03/21/1941 DOA: 04/15/2020 PCP: Billie Ruddy I, NP   LOS: 1 day   Brief narrative: As per HPI,  HPI: Brandon Young. is a 79 y.o. 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   Patient was unable to provide any history, is reportedly ambulatory but does not speak at baseline, fell from a standing position at the SNF, and appeared to be experiencing left hip pain.  Family cannot be reached despite multiple attempts from the ED.  ED Course: Upon arrival to the ED, patient is found to be afebrile, saturating mid 69G, and with systolic blood pressure of 110.  EKG featured a sinus rhythm and chest x-ray with very mild chronic appearing increased lung markings and very mild atelectasis on the right.  Radiographs of the hip demonstrated minimally displaced fracture of the left femoral neck.  Chemistry panel is unremarkable and CBC notable for a normocytic anemia with hemoglobin 10.9.  Orthopedic surgery was consulted by the ED physician and recommended medical admission to [facility].  Patient was treated with morphine, IV fluids, and Covid screening test was ordered.  Assessment/Plan:  Principal Problem:   Displaced intertrochanteric fracture of left femur, initial encounter for closed fracture [facility]) Active Problems:   Dementia (HCC)   COPD (chronic obstructive pulmonary disease) ([place])   Minimally displaced left hip fracture  of the femoral neck Status post fall.  Orthopedics has been consulted.  Continue IV fluids n.p.o. pain control.  Hold aspirin. History of recurrent falls in the past.   Parkinson disease with advanced dementia  Does not communicate at baseline.  Is minimally ambulatory with walker and has been deteriorationg.  Continue Sinemet, Lexapro, Risperdal     History of COPD  Compensated at this time.  Continue inhalers  VTE Prophylaxis: Sequential compression device in anticipation of surgery  Code Status: DNR  Family Communication: I spoke with the patient's daughter in law and updated her about the clinical condition of the patient.    Status is: Inpatient  Remains inpatient appropriate because:Unsafe d/c plan, Inpatient level of care appropriate due to severity of illness and Need for hip surgery  Dispo: The patient is from: memory care unit              Anticipated d/c is to: SNF              Anticipated d/c date is: 2 days-3 days              Patient currently is not medically stable to d/c.  Consultants:  Orthopedics  Procedures:  None so far  Antibiotics:  . Preop cefazolin  Anti-infectives (From admission, onward)   Start     Dose/Rate Route Frequency Ordered Stop   04/17/20 0600  ceFAZolin (ANCEF) IVPB 2g/100 mL premix     2 g 200 mL/hr over 30 Minutes Intravenous On call to O.R. 04/16/20 1953 04/18/20 0559     Subjective: Today, patient was seen and examined at bedside.  Patient is a poor historian and is not able to converse.  Objective: Vitals:   04/17/20 0021 04/17/20 0437  BP: 123/79 115/78  Pulse: 68 65  Resp: 17 17  Temp: 99.2 F (37.3 C) 98.9 F (37.2 C)  SpO2: 100% 98%    Intake/Output Summary (Last 24 hours) at 04/17/2020 [phone]  Last data filed at 04/17/2020 0437 Gross per 24 hour  Intake 0 ml  Output 450 ml  Net -450 ml   Filed Weights   04/16/20 2014  Weight: 63.5 kg   Body mass index is 21.29 kg/m.   Physical Exam:  GENERAL: Patient is alert awake but not communicative. Not in obvious distress. HENT: No scleral pallor or icterus. Pupils equally reactive to light. Oral mucosa is moist NECK: is supple, no gross swelling noted. CHEST: Clear to auscultation. No crackles or wheezes.  Diminished breath sounds bilaterally. CVS: S1 and S2 heard, no murmur. Regular rate and rhythm.   ABDOMEN: Soft, non-tender, bowel sounds are present. EXTREMITIES: No edema.  Diffuse tremors at rest.  Not moving left lower extremity. CNS: No communicative.  Diffuse tremors. SKIN: warm and dry without rashes.  Data Review: I have personally reviewed the following laboratory data and studies,  CBC: Recent Labs  Lab 04/15/20 2315 04/16/20 0500  WBC 8.8 8.4  NEUTROABS 7.5  --   HGB 10.9* 10.6*  HCT 35.3* 34.2*  MCV 96.2 95.0  PLT 189 181   Basic Metabolic Panel: Recent Labs  Lab 04/15/20 2315 04/16/20 0500  NA 138 140  K 4.1 4.0  CL 104 105  CO2 26 28  GLUCOSE 132* 133*  BUN 20 21  CREATININE 0.80 0.67  CALCIUM 8.0* 8.1*   Liver Function Tests: No results for input(s): AST, ALT, ALKPHOS, BILITOT, PROT, ALBUMIN in the last 168 hours. No results for input(s): LIPASE, AMYLASE in the last 168 hours. No results for input(s): AMMONIA in the last 168 hours. Cardiac Enzymes: No results for input(s): CKTOTAL, CKMB, CKMBINDEX, TROPONINI in the last 168 hours. BNP (last 3 results) No results for input(s): BNP in the last 8760 hours.  ProBNP (last 3 results) No results for input(s): PROBNP in the last 8760 hours.  CBG: No results for input(s): GLUCAP in the last 168 hours. Recent Results (from the past 240 hour(s))  Respiratory Panel by RT PCR (Flu A&B, Covid) - Nasopharyngeal Swab     Status: None   Collection Time: 04/15/20 11:30 PM   Specimen: Nasopharyngeal Swab  Result Value Ref Range Status   SARS Coronavirus 2 by RT PCR NEGATIVE NEGATIVE Final    Comment: (NOTE) SARS-CoV-2 target nucleic acids are NOT DETECTED. The SARS-CoV-2 RNA is generally detectable in upper respiratoy specimens during the acute phase of infection. The lowest concentration of SARS-CoV-2 viral copies this assay can detect is 131 copies/mL. A negative result does not preclude SARS-Cov-2 infection and should not be used as the sole basis for treatment or other patient management decisions. A  negative result may occur with  improper specimen collection/handling, submission of specimen other than nasopharyngeal swab, presence of viral mutation(s) within the areas targeted by this assay, and inadequate number of viral copies (<131 copies/mL). A negative result must be combined with clinical observations, patient history, and epidemiological information. The expected result is Negative. Fact Sheet for Patients:  https://www.moore.com/ Fact Sheet for Healthcare Providers:  https://www.young.biz/ This test is not yet ap proved or cleared by the [place] FDA and  has been authorized for detection and/or diagnosis of SARS-CoV-2 by FDA under an Emergency Use Authorization (EUA). This EUA will remain  in effect (meaning this test can be used) for the duration of the COVID-19 declaration under Section 564(b)(1) of the Act, 21 U.S.C. section 360bbb-3(b)(1), unless the authorization is terminated or revoked sooner.    Influenza A by PCR NEGATIVE NEGATIVE  Final   Influenza B by PCR NEGATIVE NEGATIVE Final    Comment: (NOTE) The Xpert Xpress SARS-CoV-2/FLU/RSV assay is intended as an aid in  the diagnosis of influenza from Nasopharyngeal swab specimens and  should not be used as a sole basis for treatment. Nasal washings and  aspirates are unacceptable for Xpert Xpress SARS-CoV-2/FLU/RSV  testing. Fact Sheet for Patients: https://www.moore.com/ Fact Sheet for Healthcare Providers: https://www.young.biz/ This test is not yet approved or cleared by the [place] FDA and  has been authorized for detection and/or diagnosis of SARS-CoV-2 by  FDA under an Emergency Use Authorization (EUA). This EUA will remain  in effect (meaning this test can be used) for the duration of the  Covid-19 declaration under Section 564(b)(1) of the Act, 21  U.S.C. section 360bbb-3(b)(1), unless the authorization is    terminated or revoked. Performed at [facility], [address] [place], [place] [address]   MRSA PCR Screening     Status: None   Collection Time: 04/16/20 11:40 PM   Specimen: Nasal Mucosa; Nasopharyngeal  Result Value Ref Range Status   MRSA by PCR NEGATIVE NEGATIVE Final    Comment:        The GeneXpert MRSA Assay (FDA approved for NASAL specimens only), is one component of a comprehensive MRSA colonization surveillance program. It is not intended to diagnose MRSA infection nor to guide or monitor treatment for MRSA infections. Performed at [facility], [address], [place], [place] [address]      Studies: DG Chest Port 1 View  Result Date: 04/16/2020 CLINICAL DATA:  Preoperative evaluation prior to fractured hip repair. EXAM: PORTABLE CHEST 1 VIEW COMPARISON:  April 12, 2013 FINDINGS: Very mild, chronic appearing increased lung markings are seen. Very mild atelectasis is seen within the mid right lung. There is no evidence of a pleural effusion or pneumothorax. The heart size and mediastinal contours are within normal limits. Radiopaque surgical clips are seen overlying the expected region of the esophageal hiatus. The visualized skeletal structures are unremarkable. IMPRESSION: Very mild, chronic-appearing increased lung markings with very mild mid right lung atelectasis. Electronically Signed   By: Aram Candela M.D.   On: 04/16/2020 01:13   DG Hip Unilat W or Wo Pelvis 2-3 Views Left  Result Date: 04/15/2020 CLINICAL DATA:  79 year old male with fall and left hip pain. EXAM: DG HIP (WITH OR WITHOUT PELVIS) 2-3V LEFT COMPARISON:  None. FINDINGS: There is a minimally displaced basicervical or intertrochanteric fracture of the left femoral neck. No dislocation. The bones are osteopenic. The soft tissues are unremarkable. IMPRESSION: Minimally displaced fracture of the left femoral neck. Electronically Signed   By: Elgie Collard M.D.   On:  04/15/2020 22:21     Joycelyn Das, MD  [facility] 04/17/2020

## 2020-04-17 NOTE — Anesthesia Procedure Notes (Signed)
Procedure Name: MAC Date/Time: 04/17/2020 3:35 PM Performed by: Kathryne Hitch, CRNA Pre-anesthesia Checklist: Patient identified, Emergency Drugs available, Suction available and Patient being monitored Patient Re-evaluated:Patient Re-evaluated prior to induction Oxygen Delivery Method: Simple face mask Preoxygenation: Pre-oxygenation with 100% oxygen Induction Type: IV induction Dental Injury: Teeth and Oropharynx as per pre-operative assessment

## 2020-04-17 NOTE — Progress Notes (Signed)
Initial Nutrition Assessment  DOCUMENTATION CODES:   Not applicable  INTERVENTION:   Recommend supplement diet once advanced post op   NUTRITION DIAGNOSIS:   Increased nutrient needs related to post-op healing as evidenced by estimated needs.  GOAL:   Patient will meet greater than or equal to 90% of their needs  MONITOR:   PO intake, Supplement acceptance  REASON FOR ASSESSMENT:   Consult Assessment of nutrition requirement/status  ASSESSMENT:   Pt with PMH of COPD, Parkinsons' disease, and dementia admitted after fall at memory care with L hip fx.   Pt NPO for surgery today Plan for SLP eval tomorrow No recent weight history available.   Medications reviewed and include: senokot-s Labs reviewed    NUTRITION - FOCUSED PHYSICAL EXAM:  Deferred  Diet Order:   Diet Order            Diet NPO time specified Except for: Sips with Meds  Diet effective midnight              EDUCATION NEEDS:   No education needs have been identified at this time  Skin:  Skin Assessment: Reviewed RN Assessment  Last BM:  unknown  Height:   Ht Readings from Last 1 Encounters:  04/16/20 5\' 8"  (1.727 m)    Weight:   Wt Readings from Last 1 Encounters:  04/16/20 63.5 kg    Ideal Body Weight:  70 kg  BMI:  Body mass index is 21.29 kg/m.  Estimated Nutritional Needs:   Kcal:  1600-1800  Protein:  80-95 grams  Fluid:  >1.6 L/day  04/18/20., RD, LDN, CNSC See AMiON for contact information

## 2020-04-17 NOTE — Anesthesia Procedure Notes (Addendum)
Spinal  Patient location during procedure: OR Start time: 04/17/2020 3:24 PM End time: 04/17/2020 3:34 PM Staffing Performed: anesthesiologist  Anesthesiologist: Heather Roberts, MD Preanesthetic Checklist Completed: patient identified, IV checked, risks and benefits discussed, surgical consent, monitors and equipment checked, pre-op evaluation and timeout performed Spinal Block Patient position: right lateral decubitus Prep: DuraPrep Patient monitoring: cardiac monitor, continuous pulse ox and blood pressure Approach: midline Location: L2-3 Injection technique: single-shot Needle Needle type: Quincke  Needle gauge: 22 G Needle length: 9 cm Additional Notes Functioning IV was confirmed and monitors were applied. Sterile prep and drape, including hand hygiene and sterile gloves were used. The patient was positioned and the spine was prepped. The skin was anesthetized with lidocaine.  Free flow of clear CSF was obtained prior to injecting local anesthetic into the CSF.  The spinal needle aspirated freely following injection.  The needle was carefully withdrawn.  The patient tolerated the procedure well.

## 2020-04-17 NOTE — Transfer of Care (Signed)
Immediate Anesthesia Transfer of Care Note  Patient: Brandon Young.  Procedure(s) Performed: INTRAMEDULLARY (IM) NAIL INTERTROCHANTRIC (Left Hip)  Patient Location: PACU  Anesthesia Type:Spinal  Level of Consciousness: drowsy  Airway & Oxygen Therapy: Patient Spontanous Breathing and Patient connected to face mask oxygen  Post-op Assessment: Report given to RN and Post -op Vital signs reviewed and stable  Post vital signs: Reviewed and stable  Last Vitals:  Vitals Value Taken Time  BP 103/74 04/17/20 1637  Temp    Pulse 48 04/17/20 1641  Resp 10 04/17/20 1641  SpO2 100 % 04/17/20 1641  Vitals shown include unvalidated device data.  Last Pain:  Vitals:   04/17/20 1455  TempSrc:   PainSc: 0-No pain      Patients Stated Pain Goal: 2 (04/17/20 1455)  Complications: No apparent anesthesia complications

## 2020-04-17 NOTE — Discharge Instructions (Signed)
° ° °  1. Change dressings as needed °2. May shower but keep incisions covered and dry °3. Take lovenox to prevent blood clots °4. Take stool softeners as needed °5. Take pain meds as needed ° °

## 2020-04-17 NOTE — Op Note (Signed)
   Date of Surgery: 04/17/2020  INDICATIONS: Mr. Brobeck is a 79 y.o.-year-old male who sustained a left hip fracture. The risks and benefits of the procedure discussed with the family prior to the procedure and all questions were answered; consent was obtained.  PREOPERATIVE DIAGNOSIS: left intertrochanteric hip fracture   POSTOPERATIVE DIAGNOSIS: Same   PROCEDURE: Open treatment of intertrochanteric fracture with intramedullary implant. CPT [phone]   SURGEON: N. Glee Arvin, M.D.   ASSIST: Starlyn Skeans [place], [place]; necessary for the timely completion of procedure and due to complexity of procedure.  ANESTHESIA: general   IV FLUIDS AND URINE: See anesthesia record   ESTIMATED BLOOD LOSS: 100 cc  IMPLANTS: [employer] InterTAN 10 x 40, 90/85 lag screws  DRAINS: None.   COMPLICATIONS: see description of procedure.   DESCRIPTION OF PROCEDURE: The patient was brought to the operating room and placed supine on the operating table. The patient's leg had been signed prior to the procedure. The patient had the anesthesia placed by the anesthesiologist. The prep verification and incision time-outs were performed to confirm that this was the correct patient, site, side and location. The patient had an SCD on the opposite lower extremity. The patient did receive antibiotics prior to the incision and was re-dosed during the procedure as needed at indicated intervals. The patient was positioned on the fracture table with the table in traction and internal rotation to reduce the hip. The well leg was placed in a scissor position and all bony prominences were well-padded. The patient had the lower extremity prepped and draped in the standard surgical fashion. The incision was made 4 finger breadths superior to the greater trochanter. A guide pin was inserted into the tip of the greater trochanter under fluoroscopic guidance. An opening reamer was used to gain access to the femoral canal. The nail  length was measured and inserted down the femoral canal to its proper depth. The appropriate version of insertion for the lag screw was found under fluoroscopy. A pin was inserted up the femoral neck through the jig. Then, a second antirotation pin was inserted inferior to the first pin. The length of the lag screw was then measured. The lag screw was inserted as near to center-center in the head as possible. The antirotation pin was then taken out and an interdigitating compression screw was placed in its place. The leg was taken out of traction, then the interdigitating compression screw was used to compress across the fracture. Compression was visualized on serial xrays.  The wound was copiously irrigated with saline and the subcutaneous layer closed with 2.0 vicryl and the skin was reapproximated with staples. The wounds were cleaned and dried a final time and a sterile dressing was placed. The hip was taken through a range of motion at the end of the case under fluoroscopic imaging to visualize the approach-withdraw phenomenon and confirm implant length in the head. The patient was then awakened from anesthesia and taken to the recovery room in stable condition. All counts were correct at the end of the case.   POSTOPERATIVE PLAN: The patient will be weight bearing as tolerated and will return in 2 weeks for staple removal and the patient will receive DVT prophylaxis based on other medications, activity level, and risk ratio of bleeding to thrombosis.   Mayra Reel, MD [facility] 4:18 PM

## 2020-04-17 NOTE — Plan of Care (Signed)

## 2020-04-17 NOTE — Consult Note (Signed)
ORTHOPAEDIC CONSULTATION  REQUESTING PHYSICIAN: Joycelyn Das, MD  Chief Complaint: Left intertrochanteric fracture  HPI: Brandon Stan. is a 79 y.o. male who presents with left hip fracture s/p mechanical fall at his SNF.  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  His son is at the bedside.  He is in severe pain with any movement.  Past Medical History:  Diagnosis Date  . Acute bronchitis 04/14/2013  . Asthma    childhood- age 34  . COPD (chronic obstructive pulmonary disease) (HCC) 05/02/2013  . Dementia (HCC)   . Elevated PSA 08/22/2013  . History of blood transfusion 1988  . History of chicken pox    childhood  . Medicare annual wellness visit, subsequent 10/01/2015  . Other and unspecified hyperlipidemia 08/22/2013  . Preventative health care 10/11/2016  . Ulcer of abdomen wall (HCC) 1988  . Vasomotor rhinitis 12/14/2013   Past Surgical History:  Procedure Laterality Date  . ABDOMINAL SURGERY  1988   bleed ulcer  . HERNIA REPAIR  2013  . TONSILLECTOMY     Social History   Socioeconomic History  . Marital status: Married    Spouse name: martha  . Number of children: 2  . Years of education: MED  . Highest education level: Not on file  Occupational History  . Occupation: retired  Tobacco Use  . Smoking status: Former Games developer  . Smokeless tobacco: Never Used  . Tobacco comment: 1 ppd for 30 years  Substance and Sexual Activity  . Alcohol use: Yes    Comment: wine  . Drug use: No  . Sexual activity: Yes    Comment: lives with wife, no major dietary restrictions   Other Topics Concern  . Not on file  Social History Narrative   Patient lives at home with spouse.   Caffeine Use: 2 cups daily   Social Determinants of Health   Financial Resource Strain:   . Difficulty of Paying Living Expenses:   Food Insecurity:   . Worried About Programme researcher, broadcasting/film/video in the Last Year:   . Barista in the Last Year:   Transportation  Needs:   . Freight forwarder (Medical):   Marland Kitchen Lack of Transportation (Non-Medical):   Physical Activity:   . Days of Exercise per Week:   . Minutes of Exercise per Session:   Stress:   . Feeling of Stress :   Social Connections:   . Frequency of Communication with Friends and Family:   . Frequency of Social Gatherings with Friends and Family:   . Attends Religious Services:   . Active Member of Clubs or Organizations:   . Attends Banker Meetings:   Marland Kitchen Marital Status:    Family History  Problem Relation Age of Onset  . Hypertension Mother   . Dementia Mother   . Other Father        GI infection/disturbance  . Tremor Father   . COPD Sister        smoker  . Tremor Sister   . Tremor Brother   . Tremor Sister   . Colon cancer Neg Hx   . Heart disease Neg Hx   . Prostate cancer Neg Hx   . Breast cancer Neg Hx   . Diabetes Neg Hx    No Known Allergies Prior to Admission medications   Medication Sig Start Date End Date Taking? Authorizing Provider  acetaminophen (TYLENOL) 325 MG tablet Take 650 mg by  mouth every 4 (four) hours as needed for mild pain.   Yes [provider]  aspirin 81 MG tablet Take 81 mg by mouth daily.   Yes [provider]  atorvastatin (LIPITOR) 10 MG tablet TAKE 1 TABLET BY MOUTH ONCE DAILY Patient taking differently: Take 10 mg by mouth daily.  11/07/17  Yes Mosie Lukes, MD  carbidopa-levodopa (SINEMET IR) 25-100 MG per tablet Take 2 tablets by mouth in the morning and at bedtime.    Yes [provider]  donepezil (ARICEPT) 10 MG tablet TAKE 1 TABLET BY MOUTH AT BEDTIME Patient taking differently: Take 10 mg by mouth at bedtime.  12/18/15  Yes Penumalli, Vikram R, MD  escitalopram (LEXAPRO) 10 MG tablet TAKE 1 TABLET EVERY DAY Patient taking differently: Take 10 mg by mouth daily.  10/19/14  Yes Philmore Pali, NP  furosemide (LASIX) 20 MG tablet Take 10 mg by mouth See admin instructions. Every two days starting  01/04/20   Yes [provider]  ipratropium-albuterol (DUONEB) 0.5-2.5 (3) MG/3ML SOLN Take 3 mLs by nebulization in the morning, at noon, and at bedtime.   Yes [provider]  loratadine (CLARITIN) 10 MG tablet Take 10 mg by mouth daily.   Yes [provider]  melatonin 3 MG TABS tablet Take 3 mg by mouth at bedtime.   Yes [provider]  Multiple Vitamin (MULTIVITAMIN WITH MINERALS) TABS tablet Take 1 tablet by mouth daily.   Yes [provider]  omeprazole (PRILOSEC) 20 MG capsule Take 20 mg by mouth 2 (two) times daily before a meal.   Yes [provider]  ondansetron (ZOFRAN-ODT) 4 MG disintegrating tablet Take 1 tablet (4 mg total) by mouth every 8 (eight) hours as needed for nausea. 06/22/14  Yes Penumalli, Earlean Polka, MD  primidone (MYSOLINE) 50 MG tablet Take 100 mg by mouth daily. Tapering scale 10/19/14  Yes [provider]  risperiDONE (RISPERDAL) 0.25 MG tablet Take 0.125 mg by mouth 2 (two) times daily.   Yes [provider]  tamsulosin (FLOMAX) 0.4 MG CAPS capsule Take 0.4 mg by mouth daily.   Yes [provider]  vitamin C (ASCORBIC ACID) 250 MG tablet Take 500 mg by mouth daily.   Yes [provider]  Vitamin D, Ergocalciferol, (DRISDOL) 1.25 MG (50000 UNIT) CAPS capsule Take 50,000 Units by mouth every 7 (seven) days.   Yes [provider]  zinc gluconate 50 MG tablet Take 50 mg by mouth daily.   Yes [provider]  albuterol (PROVENTIL HFA;VENTOLIN HFA) 108 (90 BASE) MCG/ACT inhaler Inhale 2 puffs into the lungs every 6 (six) hours as needed for wheezing or shortness of breath. Patient not taking: Reported on 04/15/2020 04/30/13   Mosie Lukes, MD  enoxaparin (LOVENOX) 40 MG/0.4ML injection Inject 0.4 mLs (40 mg total) into the skin daily. 04/17/20   Leandrew Koyanagi, MD  HYDROcodone-acetaminophen (NORCO) 5-325 MG tablet Take 1-2 tablets by mouth 3 (three) times daily as needed.  04/17/20   Leandrew Koyanagi, MD   DG Chest Port 1 View  Result Date: 04/16/2020 CLINICAL DATA:  Preoperative evaluation prior to fractured hip repair. EXAM: PORTABLE CHEST 1 VIEW COMPARISON:  April 12, 2013 FINDINGS: Very mild, chronic appearing increased lung markings are seen. Very mild atelectasis is seen within the mid right lung. There is no evidence of a pleural effusion or pneumothorax. The heart size and mediastinal contours are within normal limits. Radiopaque surgical clips are seen  overlying the expected region of the esophageal hiatus. The visualized skeletal structures are unremarkable. IMPRESSION: Very mild, chronic-appearing increased lung markings with very mild mid right lung atelectasis. Electronically Signed   By: Aram Candela M.D.   On: 04/16/2020 01:13   DG C-Arm 1-60 Min  Result Date: 04/17/2020 CLINICAL DATA:  Known left hip fracture EXAM: LEFT FEMUR 2 VIEWS; DG C-ARM 1-60 MIN COMPARISON:  None. FLUOROSCOPY TIME:  Radiation Exposure Index (as provided by the fluoroscopic device): Not available If the device does not provide the exposure index: Fluoroscopy Time:  53 seconds Number of Acquired Images:  2 FINDINGS: Medullary rod is noted in the proximal left femur with 2 fixation screws traversing the femoral neck. Fracture fragments are in near anatomic alignment. IMPRESSION: ORIF of proximal left hip fracture. Electronically Signed   By: Alcide Clever M.D.   On: 04/17/2020 16:53   DG Hip Unilat W or Wo Pelvis 2-3 Views Left  Result Date: 04/15/2020 CLINICAL DATA:  79 year old male with fall and left hip pain. EXAM: DG HIP (WITH OR WITHOUT PELVIS) 2-3V LEFT COMPARISON:  None. FINDINGS: There is a minimally displaced basicervical or intertrochanteric fracture of the left femoral neck. No dislocation. The bones are osteopenic. The soft tissues are unremarkable. IMPRESSION: Minimally displaced fracture of the left femoral neck. Electronically Signed   By: Elgie Collard M.D.   On:  04/15/2020 22:21   DG FEMUR MIN 2 VIEWS LEFT  Result Date: 04/17/2020 CLINICAL DATA:  Known left hip fracture EXAM: LEFT FEMUR 2 VIEWS; DG C-ARM 1-60 MIN COMPARISON:  None. FLUOROSCOPY TIME:  Radiation Exposure Index (as provided by the fluoroscopic device): Not available If the device does not provide the exposure index: Fluoroscopy Time:  53 seconds Number of Acquired Images:  2 FINDINGS: Medullary rod is noted in the proximal left femur with 2 fixation screws traversing the femoral neck. Fracture fragments are in near anatomic alignment. IMPRESSION: ORIF of proximal left hip fracture. Electronically Signed   By: Alcide Clever M.D.   On: 04/17/2020 16:53    All pertinent xrays, MRI, CT independently reviewed and interpreted  Positive ROS: All other systems have been reviewed and were otherwise negative with the exception of those mentioned in the HPI and as above.  Physical Exam: General: No acute distress Cardiovascular: No pedal edema Respiratory: No cyanosis, no use of accessory musculature GI: No organomegaly, abdomen is soft and non-tender Skin: No lesions in the area of chief complaint Neurologic: Sensation intact distally Psychiatric: Patient is at baseline mood and affect Lymphatic: No axillary or cervical lymphadenopathy  MUSCULOSKELETAL:  - severe pain with movement of the hip and extremity - skin intact - NVI distally - compartments soft  Assessment: Left intertrochanteric hip fracture  Plan: -Surgical repair has been recommended for pain relief and early mobilization -He is minimally ambulatory at baseline due to his Parkinson's but his son wishes to have the surgery for pain relief.  Risk benefits alternatives discussed with the patient's son who is in agreement to proceed.  Thank you for the consult and the opportunity to see Mr. Bonnye Fava Glee Arvin, MD [facility] 6:15 PM

## 2020-04-18 ENCOUNTER — Encounter: Payer: Self-pay | Admitting: *Deleted

## 2020-04-18 DIAGNOSIS — S72142A Displaced intertrochanteric fracture of left femur, initial encounter for closed fracture: Secondary | ICD-10-CM | POA: Diagnosis not present

## 2020-04-18 DIAGNOSIS — F0281 Dementia in other diseases classified elsewhere with behavioral disturbance: Secondary | ICD-10-CM | POA: Diagnosis not present

## 2020-04-18 DIAGNOSIS — J418 Mixed simple and mucopurulent chronic bronchitis: Secondary | ICD-10-CM | POA: Diagnosis not present

## 2020-04-18 DIAGNOSIS — G20 Parkinson's disease: Secondary | ICD-10-CM | POA: Diagnosis not present

## 2020-04-18 LAB — BASIC METABOLIC PANEL
Anion gap: 9 (ref 5–15)
BUN: 14 mg/dL (ref 8–23)
CO2: 28 mmol/L (ref 22–32)
Calcium: 8.2 mg/dL — ABNORMAL LOW (ref 8.9–10.3)
Chloride: 104 mmol/L (ref 98–111)
Creatinine, Ser: 0.62 mg/dL (ref 0.61–1.24)
GFR calc Af Amer: >60 mL/min (ref >60)
GFR calc non Af Amer: >60 mL/min (ref >60)
Glucose, Bld: 116 mg/dL — ABNORMAL HIGH (ref 70–99)
Potassium: 3.7 mmol/L (ref 3.5–5.1)
Sodium: 141 mmol/L (ref 135–145)

## 2020-04-18 LAB — PHOSPHORUS: Phosphorus: 3.3 mg/dL (ref 2.5–4.6)

## 2020-04-18 LAB — CBC
HCT: 31.7 % — ABNORMAL LOW (ref 39.0–52.0)
Hemoglobin: 9.8 g/dL — ABNORMAL LOW (ref 13.0–17.0)
MCH: 29.5 pg (ref 26.0–34.0)
MCHC: 30.9 g/dL (ref 30.0–36.0)
MCV: 95.5 fL (ref 80.0–100.0)
Platelets: 156 10*3/uL (ref 150–400)
RBC: 3.32 MIL/uL — ABNORMAL LOW (ref 4.22–5.81)
RDW: 14.3 % (ref 11.5–15.5)
WBC: 7.1 10*3/uL (ref 4.0–10.5)
nRBC: 0 % (ref 0.0–0.2)

## 2020-04-18 LAB — SARS CORONAVIRUS 2 (TAT 6-24 HRS): SARS Coronavirus 2: NEGATIVE

## 2020-04-18 LAB — MAGNESIUM: Magnesium: 1.9 mg/dL (ref 1.7–2.4)

## 2020-04-18 NOTE — Plan of Care (Signed)

## 2020-04-18 NOTE — Progress Notes (Signed)
Subjective: 1 Day Post-Op Procedure(s) (LRB): INTRAMEDULLARY (IM) NAIL INTERTROCHANTRIC (Left) Patient reports pain as mild.    Objective: Vital signs in last 24 hours: Temp:  [97.4 F (36.3 C)-98.7 F (37.1 C)] 97.6 F (36.4 C) (04/27 0530) Pulse Rate:  [44-84] 78 (04/27 0530) Resp:  [8-18] 18 (04/27 0530) BP: (92-128)/(49-94) 110/70 (04/27 0530) SpO2:  [95 %-100 %] 98 % (04/27 0530) Weight:  [63.5 kg] 63.5 kg (04/26 1459)  Intake/Output from previous day: 04/26 0701 - 04/27 0700 In: 600 [I.V.:500; IV Piggyback:100] Out: 625 [Urine:550; Blood:75] Intake/Output this shift: No intake/output data recorded.  Recent Labs    04/15/20 2315 04/16/20 0500 04/17/20 2131 04/18/20 0620  HGB 10.9* 10.6* 10.0* 9.8*   Recent Labs    04/17/20 2131 04/18/20 0620  WBC 8.9 7.1  RBC 3.47* 3.32*  HCT 33.0* 31.7*  PLT 154 156   Recent Labs    04/16/20 0500 04/16/20 0500 04/17/20 2131 04/18/20 0620  NA 140  --   --  141  K 4.0  --   --  3.7  CL 105  --   --  104  CO2 28  --   --  28  BUN 21  --   --  14  CREATININE 0.67   < > 0.64 0.62  GLUCOSE 133*  --   --  116*  CALCIUM 8.1*  --   --  8.2*   < > = values in this interval not displayed.   Recent Labs    04/15/20 2315  INR 1.2    Neurovascular intact Sensation intact distally Intact pulses distally Dorsiflexion/Plantar flexion intact Incision: dressing C/D/I No cellulitis present Compartment soft   Assessment/Plan: 1 Day Post-Op Procedure(s) (LRB): INTRAMEDULLARY (IM) NAIL INTERTROCHANTRIC (Left) Up with therapy  WBAT LLE ABLA- mild and stable Lovenox 40 daily x 2 weeks for dvt ppx F/u with Dr. Roda Shutters 2 weeks post-op for suture removal      Cristie Hem 04/18/2020, 7:57 AM

## 2020-04-18 NOTE — Plan of Care (Signed)
  Problem: Clinical Measurements: Goal: Will remain free from infection Outcome: Progressing Goal: Respiratory complications will improve Outcome: Progressing Goal: Cardiovascular complication will be avoided Outcome: Progressing   Problem: Nutrition: Goal: Adequate nutrition will be maintained Outcome: Progressing   

## 2020-04-18 NOTE — Progress Notes (Signed)
Rehab Admissions Coordinator Note:  Patient was screened by Clois Dupes for appropriateness for an Inpatient Acute Rehab Consult per OT rec SNF and PT rec CIR.  Noted Memory care pta with deterioration per family  At this time, we are recommending Skilled Nursing Facility. Please call me with any questions.  Clois Dupes RN MSN 04/18/2020, 4:41 PM  I can be reached at [phone].

## 2020-04-18 NOTE — Evaluation (Signed)
Physical Therapy Evaluation Patient Details Name: Brandon Young. MRN: [id number] DOB: 1941/03/23 Today's Date: 04/18/2020   History of Present Illness  Brandon Young. is a 79 y.o. 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 Pt with L intertrochanteric fx s/p IM nail 04/17/20.   Clinical Impression  Pt admitted with above diagnosis. Pt seen with OT. Very restless and had pulled out IV catheter prior to session, RN notified. Pt followed some simple commands and responded verbally occasionally, usually with "yes/no" answers. Pt required max A +2 for bed mobility. Pt needed support with initial sitting but was able to progress to min-guard and tolerated 20 mins EOB. Pt attempted standing with max A +2 but was painful so returned to sitting. Pt with all 4 extremities tremor throughout session.  Pt currently with functional limitations due to the deficits listed below (see PT Problem List). Pt will benefit from skilled PT to increase their independence and safety with mobility to allow discharge to the venue listed below.       Follow Up Recommendations CIR;Supervision/Assistance - 24 hour    Equipment Recommendations  None recommended by PT    Recommendations for Other Services       Precautions / Restrictions Precautions Precautions: Fall Precaution Comments: Pt with severe full body tremors/dyskinesia  Restrictions Weight Bearing Restrictions: Yes LLE Weight Bearing: Weight bearing as tolerated      Mobility  Bed Mobility Overal bed mobility: Needs Assistance Bed Mobility: Rolling;Sidelying to Sit;Sit to Supine Rolling: Max assist;+2 for physical assistance Sidelying to sit: Max assist;+2 for physical assistance   Sit to supine: Max assist;+2 for physical assistance;+2 for safety/equipment   General bed mobility comments:  pt with groaning with transition into sitting and wt onto L hip, strong R lean in sitting. Deep pressure decreased tremor somewhat  Transfers Overall transfer level: Needs assistance   Transfers: Sit to/from Stand Sit to Stand: Max assist;+2 physical assistance         General transfer comment: with guiding, pt attempted to stand x 1 with mod A +2, but was unable to achieve.  As he began to lift hips from bed, he indicated pain, so pt was immediately returned to sitting   Ambulation/Gait             General Gait Details: unable  Stairs            Wheelchair Mobility    Modified Rankin (Stroke Patients Only)       Balance Overall balance assessment: Needs assistance Sitting-balance support: Feet supported;No upper extremity supported;Bilateral upper extremity supported;Single extremity supported Sitting balance-Leahy Scale: Poor Sitting balance - Comments: Pt intiially with Rt lateral lean requiring mod A for sitting balance, but he progressed to min guard - min A for EOB sitting  Postural control: Right lateral lean Standing balance support: Bilateral upper extremity supported Standing balance-Leahy Scale: Zero Standing balance comment: unable to tolerate standing                             Pertinent Vitals/Pain Pain Assessment: Faces Faces Pain Scale: Hurts even more Pain Location: Lt hip  Pain Descriptors / Indicators: Grimacing;Guarding;Moaning Pain Intervention(s): Limited activity within patient's tolerance;Monitored during session    Home Living Family/patient expects to be discharged to:: Skilled nursing facility  Additional Comments: Pt is a resident at [facility]     Prior Function Level of Independence: Needs assistance   Gait / Transfers Assistance Needed: per chart pt was ambulatory, pt unable to state     Comments: Family not present, but chart review and nsg conversations with  daughter indicate that pt was having frequent falls, and has progressed to w/c dependency to reduce falls, and he required assist for ADLs      Hand Dominance   Dominant Hand: (uncertain )    Extremity/Trunk Assessment   Upper Extremity Assessment Upper Extremity Assessment: Defer to OT evaluation RUE Deficits / Details: severe tremor/dyskinesia RUE Coordination: decreased fine motor;decreased gross motor LUE Deficits / Details: severe tremor/dyskinesia LUE Coordination: decreased fine motor;decreased gross motor    Lower Extremity Assessment Lower Extremity Assessment: RLE deficits/detail;LLE deficits/detail;Generalized weakness RLE Deficits / Details: severe tremor/dyskinesia RLE Coordination: decreased gross motor;decreased fine motor LLE Deficits / Details: severe tremor/ dyskinesia LLE Coordination: decreased fine motor;decreased gross motor    Cervical / Trunk Assessment Cervical / Trunk Assessment: Other exceptions Cervical / Trunk Exceptions: tremulous/dyskinesia   Communication   Communication: Expressive difficulties;Receptive difficulties(Pt will inconsistently answer simple questions )  Cognition Arousal/Alertness: Awake/alert Behavior During Therapy: Flat affect;Restless Overall Cognitive Status: No family/caregiver present to determine baseline cognitive functioning                                 General Comments: pt follows simple commands 50% of time and occasionally verbalizes. Pt pulling at linens and lines, had IV catheter in his hand upon OT arrival, RN notified      General Comments      Exercises     Assessment/Plan    PT Assessment Patient needs continued PT services  PT Problem List Decreased strength;Decreased range of motion;Decreased activity tolerance;Decreased balance;Decreased mobility;Decreased coordination;Decreased cognition;Decreased knowledge of use of DME;Decreased safety awareness;Decreased knowledge of  precautions;Pain       PT Treatment Interventions DME instruction;Functional mobility training;Therapeutic activities;Therapeutic exercise;Balance training;Patient/family education;Cognitive remediation    PT Goals (Current goals can be found in the Care Plan section)  Acute Rehab PT Goals Patient Stated Goal: per RN, family would like to prioritize pt's comfort over aggressive mobility and ambulation is not a high priority as he will remain transfer level for safety PT Goal Formulation: Patient unable to participate in goal setting Time For Goal Achievement: 05/02/20 Potential to Achieve Goals: Fair    Frequency Min 3X/week   Barriers to discharge        Co-evaluation PT/OT/SLP Co-Evaluation/Treatment: Yes Reason for Co-Treatment: Complexity of the patient's impairments (multi-system involvement);Necessary to address cognition/behavior during functional activity;For patient/therapist safety PT goals addressed during session: Mobility/safety with mobility;Balance OT goals addressed during session: ADL's and self-care;Strengthening/ROM       AM-PAC PT "6 Clicks" Mobility  Outcome Measure Help needed turning from your back to your side while in a flat bed without using bedrails?: A Lot Help needed moving from lying on your back to sitting on the side of a flat bed without using bedrails?: A Lot Help needed moving to and from a bed to a chair (including a wheelchair)?: Total Help needed standing up from a chair using your arms (e.g., wheelchair or bedside chair)?: Total Help needed to walk in hospital room?: Total Help needed climbing 3-5 steps with a railing? : Total 6 Click Score: 8    End of Session  Activity Tolerance: Patient limited by pain Patient left: in bed;with call bell/phone within reach;with bed alarm set Nurse Communication: Mobility status PT Visit Diagnosis: Repeated falls (R29.6);Muscle weakness (generalized) (M62.81);Difficulty in walking, not elsewhere  classified (R26.2);Unsteadiness on feet (R26.81)    Time: 0158-6825 PT Time Calculation (min) (ACUTE ONLY): 25 min   Charges:   PT Evaluation $PT Eval Moderate Complexity: [place]  Pager [phone] Office [place] 04/18/2020, 4:37 PM

## 2020-04-18 NOTE — Progress Notes (Signed)
Pt pulled out IV, no PIV access at this time, MD made aware.

## 2020-04-18 NOTE — Evaluation (Signed)
Occupational Therapy Evaluation Patient Details Name: Brandon Young. MRN: [id number] DOB: 03-12-1941 Today's Date: 04/18/2020    History of Present Illness Brandon Young. is a 79 y.o. 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 Pt with L intertrochanteric fx s/p IM nail 04/17/20.    Clinical Impression   Pt admitted with above. He demonstrates the below listed deficits and will benefit from continued OT to maximize safety and independence with BADLs.  Pt seen in conjunction with PT.  Pt very restless, picking at covers, lines, etc - he was provided with an activity mat.   He seemed to respond well with deep pressure through palms, slow movement, verbal instruction and gentle guidance with movement.  He moved to EOB with max  A+2, and was able tolerate EOB sitting ~20 mins.  He did attempt to stand, but as he lifted his hips, he had noticeable pain and returned to sitting.  He requires total A for ADLs.  He tolerated session quite well, but is limited by severity of tremors and cognitive deficits.  Will follow acutely.       Follow Up Recommendations  SNF    Equipment Recommendations  None recommended by OT    Recommendations for Other Services       Precautions / Restrictions Precautions Precautions: Fall Precaution Comments: Pt with severe full body tremors/dyskinesia  Restrictions Weight Bearing Restrictions: Yes LLE Weight Bearing: Weight bearing as tolerated      Mobility Bed Mobility Overal bed mobility: Needs Assistance Bed Mobility: Rolling;Sidelying to Sit;Sit to Supine Rolling: Max assist;+2 for physical assistance Sidelying to sit: Max assist;+2 for physical assistance   Sit to supine: Max assist;+2 for physical assistance;+2 for safety/equipment   General bed mobility comments: Pt responds well to deep  pressure into palms to guide UEs during movement.  He was able to assist minimally to roll, and to lift trunk into sitting and to back to supine.  Pt grimmaced and groaned momentarily when shifting weight towand left hip when moving into sitting, but otherwise, seemed to tolerate movement well.   Transfers Overall transfer level: Needs assistance               General transfer comment: with guiding, pt attempted to stand x 1 with mod A +2, but was unable to achieve.  As he began to lift hips from bed, he indicated pain, so pt was immediately returned to sitting     Balance Overall balance assessment: Needs assistance Sitting-balance support: Feet supported;No upper extremity supported;Bilateral upper extremity supported;Single extremity supported Sitting balance-Leahy Scale: Poor Sitting balance - Comments: Pt intiially with Rt lateral lean requiring mod A for sitting balance, but he progressed to min guard - min A for EOB sitting                                    ADL either performed or assessed with clinical judgement   ADL Overall ADL's : Needs assistance/impaired Eating/Feeding: Maximal assistance;Sitting   Grooming: Wash/dry hands;Wash/dry face;Oral care;Brushing hair;Total assistance;Bed level;Sitting   Upper Body Bathing: Total assistance;Bed level;Sitting   Lower Body Bathing: Total assistance;Bed level   Upper Body Dressing : Total assistance;Sitting;Bed level   Lower Body Dressing: Total assistance;Bed level   Toilet Transfer: Total assistance Toilet Transfer Details (  indicate cue type and reason): unable to attempt  Toileting- Clothing Manipulation and Hygiene: Total assistance;Bed level Toileting - Clothing Manipulation Details (indicate cue type and reason): Pt incontinent of urine and assisted with peri care      Functional mobility during ADLs: Maximal assistance;+2 for physical assistance(bed mobility only )       Vision   Additional  Comments: not assessed      Perception     Praxis Praxis Praxis tested?: Deficits Deficits: Initiation;Motor Impersistence;Limb apraxia    Pertinent Vitals/Pain Pain Assessment: Faces Faces Pain Scale: Hurts even more Pain Location: Lt hip  Pain Descriptors / Indicators: Grimacing;Guarding;Moaning Pain Intervention(s): Monitored during session;Limited activity within patient's tolerance;Repositioned     Hand Dominance (uncertain )   Extremity/Trunk Assessment Upper Extremity Assessment Upper Extremity Assessment: Generalized weakness;RUE deficits/detail;LUE deficits/detail RUE Deficits / Details: severe tremor/dyskinesia RUE Coordination: decreased fine motor;decreased gross motor LUE Deficits / Details: severe tremor/dyskinesia LUE Coordination: decreased fine motor;decreased gross motor   Lower Extremity Assessment Lower Extremity Assessment: Defer to PT evaluation   Cervical / Trunk Assessment Cervical / Trunk Assessment: Other exceptions Cervical / Trunk Exceptions: tremulous/dyskinesia    Communication Communication Communication: Expressive difficulties;Receptive difficulties(Pt will inconsistently answer simple questions )   Cognition Arousal/Alertness: Awake/alert Behavior During Therapy: Flat affect;Restless Overall Cognitive Status: No family/caregiver present to determine baseline cognitive functioning                                 General Comments: Pt pleasant, very restless, pulling at blankets, Lines, gown.  He had IV catheter in his hand detached from the IV.  He does not follow commands, but will follow tactile cuing to assist with movement - supine to sit, attempt to stand, etc. He will answer simple questions inconsistently with a dealy - mostly yes/no responses, and occasionally with nod/shake his head.     General Comments       Exercises     Shoulder Instructions      Home Living Family/patient expects to be discharged to::  Skilled nursing facility                                 Additional Comments: Pt is a resident at [facility]       Prior Functioning/Environment          Comments: Family not present, but chart review and nsg conversations with daughter indicate that pt was having frequent falls, and has progressed to w/c dependency to reduce falls, and he required assist for ADLs         OT Problem List: Decreased strength;Decreased activity tolerance;Impaired balance (sitting and/or standing);Decreased cognition;Decreased coordination;Decreased safety awareness;Decreased knowledge of use of DME or AE;Decreased knowledge of precautions;Impaired UE functional use;Pain      OT Treatment/Interventions: Self-care/ADL training;Neuromuscular education;Therapeutic activities;Cognitive remediation/compensation;Patient/family education;Balance training    OT Goals(Current goals can be found in the care plan section) Acute Rehab OT Goals OT Goal Formulation: Patient unable to participate in goal setting Time For Goal Achievement: 05/02/20 Potential to Achieve Goals: Good ADL Goals Pt Will Transfer to Toilet: with mod assist;with +2 assist;bedside commode  OT Frequency: Min 2X/week   Barriers to D/C: Decreased caregiver support  plans to return to [facility] PT/OT/SLP Co-Evaluation/Treatment: Yes Reason for Co-Treatment: Complexity of the patient's impairments (  multi-system involvement);Necessary to address cognition/behavior during functional activity;For patient/therapist safety;To address functional/ADL transfers   OT goals addressed during session: ADL's and self-care;Strengthening/ROM      AM-PAC OT "6 Clicks" Daily Activity     Outcome Measure Help from another person eating meals?: A Lot Help from another person taking care of personal grooming?: Total Help from another person toileting, which includes using toliet, bedpan, or  urinal?: Total Help from another person bathing (including washing, rinsing, drying)?: Total Help from another person to put on and taking off regular upper body clothing?: Total Help from another person to put on and taking off regular lower body clothing?: Total 6 Click Score: 7   End of Session Nurse Communication: Mobility status  Activity Tolerance: Patient tolerated treatment well Patient left: in bed;with call bell/phone within reach;with bed alarm set  OT Visit Diagnosis: Unsteadiness on feet (R26.81);Cognitive communication deficit (R41.841)                Time: 7341-9379 OT Time Calculation (min): 47 min Charges:  OT General Charges $OT Visit: 1 Visit OT Evaluation $OT Eval Moderate Complexity: 1 Mod OT Treatments $Neuromuscular Re-education: 8-22 mins  Eber Jones., OTR/L Acute Rehabilitation Services Pager [phone] Office [phone]   Jeani Hawking M 04/18/2020, 2:38 PM

## 2020-04-18 NOTE — Plan of Care (Signed)
  Problem: Pain Managment: Goal: General experience of comfort will improve Outcome: Progressing   Problem: Safety: Goal: Ability to remain free from injury will improve Outcome: Progressing   Problem: Skin Integrity: Goal: Risk for impaired skin integrity will decrease Outcome: Progressing   

## 2020-04-18 NOTE — TOC Initial Note (Addendum)
Transition of Care (TOC) - Initial/Assessment Note    Patient Details  Name: Brandon Young. 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  Patient was unable to provide any history, is reportedly ambulatory but does not speak at baseline, fell from a standing position at the SNF.  Patient is S/P Left intertrocanter surgery on 04/17/2020 with Dr. Erlinda Hong.  Patient is a resident of [facility] is daughter plans to send the patient back to [facility] after medical work up.  Will continue to follow.  04/18/2020 - [place] and spoke with Clyda Greener and discussed patient returning to [facility].  Patient family requests patient not having aggressive therapy at this point.  Discussed this matter with [facility] and they will return my call to determine if patient will need an FL2 or not.  04/18/20 - Case management met with the family at the bedside and patient has an available bed for [facility] authorization through the facility.  Patient's family had a circle of care meeting with the facility today to discuss goals of care and SNF placement.  Plan is for possible discharge tomorrow 04/19/20 via [facility] ambulance.  Waiting for return call from [facility] in the meantime.  New COVID will be ordered.  Expected Discharge Plan: Skilled Nursing Facility Barriers to Discharge: Continued Medical Work up   Patient Goals and CMS Choice Patient states their goals for this hospitalization and ongoing recovery are:: Patient with Parkinson's and dementia - nonverbal.  Daughter plans to return the  patient back to [facility] after medical work-up. CMS Medicare.gov Compare Post Acute Care list provided to:: Patient Represenative (must comment)(Kelly Moshe Cipro, daughter)    Expected Discharge Plan and Services Expected Discharge Plan: [place]   Discharge Planning Services: CM Consult   Living arrangements for the past 2 months: [place]                                      Prior Living Arrangements/Services Living arrangements for the past 2 months: [place]   Patient language and need for interpreter reviewed:: Yes Do you feel safe going back to the place where you live?: Yes      Need for Family Participation in Patient Care: Yes (Comment) Care giver support system in place?: Yes (comment)   Criminal Activity/Legal Involvement Pertinent to Current Situation/Hospitalization: No - Comment as needed  Activities of Daily Living      Permission Sought/Granted Permission sought to share information with : Case Manager Permission granted to share information with : Yes, Verbal Permission Granted     Permission granted to share info w AGENCY: [facility]  Permission granted to share info w Relationship: daughter, Kallan Merrick     Emotional Assessment Appearance:: Appears stated age Attitude/Demeanor/Rapport: (nonverbal - with history of Parkinson's Disease and dementia) Affect (typically observed): Quiet   Alcohol / Substance Use: Not Applicable Psych Involvement: No (comment)  Admission diagnosis:  Left displaced femoral  neck fracture ([place]) [S72.002A] Closed left hip fracture, initial encounter [facility]) [S72.002A] Patient Active Problem List   Diagnosis Date Noted  . Displaced intertrochanteric fracture of left femur, initial encounter for closed fracture ([place]) 04/16/2020  . Preventative health care 10/11/2016  . Medicare annual wellness visit, subsequent 10/01/2015  . 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  Elevated PSA 08/22/2013  . Hyperlipidemia, mixed 08/22/2013  . COPD (chronic obstructive pulmonary disease) ([place]) 05/02/2013  . Dementia ([place])   . H/O inguinal hernia repair 06/17/2012  . Hyperglycemia 06/02/2012  . Weight loss 03/31/2012  . Tremor 03/31/2012  . PUD (peptic ulcer disease) 03/31/2012  . ED (erectile dysfunction) 03/31/2012  . Inguinal hernia 03/31/2012   PCP:  Linus Mako, NP Pharmacy:   [id number] [id number]: [phone]: [phone] [address] [id number]: [phone]: [phone]    Social Determinants of Health (SDOH) Interventions    Readmission Risk Interventions Readmission Risk Prevention Plan 04/18/2020  Transportation Screening Complete  PCP or Specialist Appt within 3-5 Days Complete  HRI or [place] Town[place]  Social Work Consult for R[place]frey[place]anning/Counseling Complete  Palliative Care Screening Complete  Medication Review (Press photographer Complete  Some recent data might be hidden

## 2020-04-18 NOTE — Progress Notes (Signed)
PROGRESS NOTE  Brandon Young. MEQ:[id number] DOB: 1941/02/04 DOA: 04/15/2020 PCP: Bill Salinas I, NP   LOS: 2 days   Brief narrative: As per HPI,  HPI: Brandon Young. is a 79 y.o. 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   Patient was unable to provide any history, is reportedly ambulatory but does not speak at baseline, fell from a standing position at the SNF, and appeared to be experiencing left hip pain.  Family cannot be reached despite multiple attempts from the ED.  ED Course: Upon arrival to the ED, patient is found to be afebrile, saturating mid 90s, and with systolic blood pressure of 110.  EKG featured a sinus rhythm and chest x-ray with very mild chronic appearing increased lung markings and very mild atelectasis on the right.  Radiographs of the hip demonstrated minimally displaced fracture of the left femoral neck.  Chemistry panel is unremarkable and CBC notable for a normocytic anemia with hemoglobin 10.9.  Orthopedic surgery was consulted by the ED physician and recommended medical admission to [facility].  Patient was treated with morphine, IV fluids, and Covid screening test was ordered.  Assessment/Plan:  Principal Problem:   Displaced intertrochanteric fracture of left femur, initial encounter for closed fracture [facility]) Active Problems:   Dementia (HCC)   COPD (chronic obstructive pulmonary disease) (HCC)   Minimally displaced left hip fracture  of the femoral neck Status post fall.  Status post intramedullary nail placement on 04/17/2020 for palliative reasons..   History of recurrent falls in the past.  And is to transfer back to skilled nursing facility.  Parkinson disease with advanced dementia  Does not communicate at baseline.  Is minimally ambulatory with walker and has been deteriorationg  as per the family.  Continue Sinemet, Lexapro, Risperdal    History of COPD  Compensated at this time.  Continue inhalers  VTE Prophylaxis: Lovenox subcu  Code Status: DNR  Family Communication: None today.  I spoke with the patient's daughter in law yesterday.  Status is: Inpatient  Remains inpatient appropriate because: Status post hip surgery, transition of care planning for transfer back to skilled nursing facility  Dispo: The patient is from: memory care unit              Anticipated d/c is to: SNF, transition of care on board              Anticipated d/c date is: Likely tomorrow if okay with nursing facility.  Will get Covid test today.  Spoke with transition of care.              Patient currently is  medically stable to d/c.  Consultants:  Orthopedics  Procedures:  Intramedullary nail placement on the left hip, by orthopedics on 04/17/2020  Antibiotics:  . Preop cefazolin  Anti-infectives (From admission, onward)   Start     Dose/Rate Route Frequency Ordered Stop   04/17/20 2130  ceFAZolin (ANCEF) IVPB 2g/100 mL premix     2 g 200 mL/hr over 30 Minutes Intravenous Every 6 hours 04/17/20 2014 04/18/20 0938   04/17/20 0600  ceFAZolin (ANCEF) IVPB 2g/100 mL premix     2 g 200 mL/hr over 30 Minutes Intravenous On call to O.R. 04/16/20 1953 04/17/20 1600     Subjective: Today, patient was seen and examined at bedside with poor historian.  Unable to verbalize.  No interval complaints reported by the nursing staff.  Objective: Vitals:   04/18/20 0530 04/18/20 0758  BP: 110/70 108/60  Pulse: 78 95  Resp: 18 17  Temp: 97.6 F (36.4 C) 98.2 F (36.8 C)  SpO2: 98% 99%    Intake/Output Summary (Last 24 hours) at 04/18/2020 1400 Last data filed at 04/18/2020 1300 Gross per 24 hour  Intake 926.53 ml  Output 625 ml  Net 301.53 ml   Filed Weights   04/16/20 2014 04/17/20 1459  Weight: 63.5 kg 63.5 kg   Body mass index is 21.29 kg/m.   Physical  Exam:  GENERAL: Patient is alert awake but not communicative. Not in obvious distress.  Generalized tremors noted.  None palpable. HENT: No scleral pallor or icterus. Pupils equally reactive to light. Oral mucosa is moist NECK: is supple, no gross swelling noted. CHEST:   Diminished breath sounds bilaterally. CVS: S1 and S2 heard, no murmur. Regular rate and rhythm.  ABDOMEN: Soft, non-tender, bowel sounds are present. EXTREMITIES: No edema.  Diffuse tremors at rest.  . CNS: Non communicative.  Diffuse tremors. SKIN: warm and dry without rashes.  Data Review: I have personally reviewed the following laboratory data and studies,  CBC: Recent Labs  Lab 04/15/20 2315 04/16/20 0500 04/17/20 2131 04/18/20 0620  WBC 8.8 8.4 8.9 7.1  NEUTROABS 7.5  --   --   --   HGB 10.9* 10.6* 10.0* 9.8*  HCT 35.3* 34.2* 33.0* 31.7*  MCV 96.2 95.0 95.1 95.5  PLT 189 181 154 616   Basic Metabolic Panel: Recent Labs  Lab 04/15/20 2315 04/16/20 0500 04/17/20 2131 04/18/20 0620  NA 138 140  --  141  K 4.1 4.0  --  3.7  CL 104 105  --  104  CO2 26 28  --  28  GLUCOSE 132* 133*  --  116*  BUN 20 21  --  14  CREATININE 0.80 0.67 0.64 0.62  CALCIUM 8.0* 8.1*  --  8.2*  MG  --   --   --  1.9  PHOS  --   --   --  3.3   Liver Function Tests: No results for input(s): AST, ALT, ALKPHOS, BILITOT, PROT, ALBUMIN in the last 168 hours. No results for input(s): LIPASE, AMYLASE in the last 168 hours. No results for input(s): AMMONIA in the last 168 hours. Cardiac Enzymes: No results for input(s): CKTOTAL, CKMB, CKMBINDEX, TROPONINI in the last 168 hours. BNP (last 3 results) No results for input(s): BNP in the last 8760 hours.  ProBNP (last 3 results) No results for input(s): PROBNP in the last 8760 hours.  CBG: No results for input(s): GLUCAP in the last 168 hours. Recent Results (from the past 240 hour(s))  Respiratory Panel by RT PCR (Flu A&B, Covid) - Nasopharyngeal Swab     Status: None    Collection Time: 04/15/20 11:30 PM   Specimen: Nasopharyngeal Swab  Result Value Ref Range Status   SARS Coronavirus 2 by RT PCR NEGATIVE NEGATIVE Final    Comment: (NOTE) SARS-CoV-2 target nucleic acids are NOT DETECTED. The SARS-CoV-2 RNA is generally detectable in upper respiratoy specimens during the acute phase of infection. The lowest concentration of SARS-CoV-2 viral copies this assay can detect is 131 copies/mL. A negative result does not preclude SARS-Cov-2 infection and should not be used as the sole basis for treatment or other patient management decisions. A negative result may occur with  improper specimen collection/handling, submission  of specimen other than nasopharyngeal swab, presence of viral mutation(s) within the areas targeted by this assay, and inadequate number of viral copies (<131 copies/mL). A negative result must be combined with clinical observations, patient history, and epidemiological information. The expected result is Negative. Fact Sheet for Patients:  https://www.moore.com/ Fact Sheet for Healthcare Providers:  https://www.young.biz/ This test is not yet ap proved or cleared by the [place] FDA and  has been authorized for detection and/or diagnosis of SARS-CoV-2 by FDA under an Emergency Use Authorization (EUA). This EUA will remain  in effect (meaning this test can be used) for the duration of the COVID-19 declaration under Section 564(b)(1) of the Act, 21 U.S.C. section 360bbb-3(b)(1), unless the authorization is terminated or revoked sooner.    Influenza A by PCR NEGATIVE NEGATIVE Final   Influenza B by PCR NEGATIVE NEGATIVE Final    Comment: (NOTE) The Xpert Xpress SARS-CoV-2/FLU/RSV assay is intended as an aid in  the diagnosis of influenza from Nasopharyngeal swab specimens and  should not be used as a sole basis for treatment. Nasal washings and  aspirates are unacceptable for Xpert Xpress  SARS-CoV-2/FLU/RSV  testing. Fact Sheet for Patients: https://www.moore.com/ Fact Sheet for Healthcare Providers: https://www.young.biz/ This test is not yet approved or cleared by the [place] FDA and  has been authorized for detection and/or diagnosis of SARS-CoV-2 by  FDA under an Emergency Use Authorization (EUA). This EUA will remain  in effect (meaning this test can be used) for the duration of the  Covid-19 declaration under Section 564(b)(1) of the Act, 21  U.S.C. section 360bbb-3(b)(1), unless the authorization is  terminated or revoked. Performed at [facility], [address], [place], [place] [address]   MRSA PCR Screening     Status: None   Collection Time: 04/16/20 11:40 PM   Specimen: Nasal Mucosa; Nasopharyngeal  Result Value Ref Range Status   MRSA by PCR NEGATIVE NEGATIVE Final    Comment:        The GeneXpert MRSA Assay (FDA approved for NASAL specimens only), is one component of a comprehensive MRSA colonization surveillance program. It is not intended to diagnose MRSA infection nor to guide or monitor treatment for MRSA infections. Performed at [facility], [address] [place], [place] [address]      Studies: Pelvis Portable  Result Date: 04/17/2020 CLINICAL DATA:  History of hip replacement EXAM: PORTABLE PELVIS 1-2 VIEWS COMPARISON:  04/15/2020 FINDINGS: Limited evaluation of right femoral neck due to positioning. Interval intramedullary rod fixation of proximal left femur fracture with anatomic alignment. Gas in the soft tissues consistent with recent surgery. IMPRESSION: Interval surgical fixation of proximal left femur fracture with expected surgical changes Electronically Signed   By: Jasmine Pang M.D.   On: 04/17/2020 19:07   DG C-Arm 1-60 Min  Result Date: 04/17/2020 CLINICAL DATA:  Known left hip fracture EXAM: LEFT FEMUR 2 VIEWS; DG C-ARM 1-60 MIN COMPARISON:  None.  FLUOROSCOPY TIME:  Radiation Exposure Index (as provided by the fluoroscopic device): Not available If the device does not provide the exposure index: Fluoroscopy Time:  53 seconds Number of Acquired Images:  2 FINDINGS: Medullary rod is noted in the proximal left femur with 2 fixation screws traversing the femoral neck. Fracture fragments are in near anatomic alignment. IMPRESSION: ORIF of proximal left hip fracture. Electronically Signed   By: Alcide Clever M.D.   On: 04/17/2020 16:53   DG FEMUR MIN 2 VIEWS LEFT  Result Date:  04/17/2020 CLINICAL DATA:  Known left hip fracture EXAM: LEFT FEMUR 2 VIEWS; DG C-ARM 1-60 MIN COMPARISON:  None. FLUOROSCOPY TIME:  Radiation Exposure Index (as provided by the fluoroscopic device): Not available If the device does not provide the exposure index: Fluoroscopy Time:  53 seconds Number of Acquired Images:  2 FINDINGS: Medullary rod is noted in the proximal left femur with 2 fixation screws traversing the femoral neck. Fracture fragments are in near anatomic alignment. IMPRESSION: ORIF of proximal left hip fracture. Electronically Signed   By: Alcide Clever M.D.   On: 04/17/2020 16:53     Joycelyn Das, MD  [facility] 04/18/2020

## 2020-04-19 DIAGNOSIS — S72142A Displaced intertrochanteric fracture of left femur, initial encounter for closed fracture: Secondary | ICD-10-CM | POA: Diagnosis not present

## 2020-04-19 DIAGNOSIS — D649 Anemia, unspecified: Secondary | ICD-10-CM

## 2020-04-19 DIAGNOSIS — J418 Mixed simple and mucopurulent chronic bronchitis: Secondary | ICD-10-CM | POA: Diagnosis not present

## 2020-04-19 DIAGNOSIS — G20 Parkinson's disease: Secondary | ICD-10-CM | POA: Diagnosis not present

## 2020-04-19 LAB — CBC
HCT: 31.2 % — ABNORMAL LOW (ref 39.0–52.0)
Hemoglobin: 9.6 g/dL — ABNORMAL LOW (ref 13.0–17.0)
MCH: 28.7 pg (ref 26.0–34.0)
MCHC: 30.8 g/dL (ref 30.0–36.0)
MCV: 93.4 fL (ref 80.0–100.0)
Platelets: 164 10*3/uL (ref 150–400)
RBC: 3.34 MIL/uL — ABNORMAL LOW (ref 4.22–5.81)
RDW: 13.9 % (ref 11.5–15.5)
WBC: 7.5 10*3/uL (ref 4.0–10.5)
nRBC: 0 % (ref 0.0–0.2)

## 2020-04-19 LAB — BASIC METABOLIC PANEL
Anion gap: 9 (ref 5–15)
BUN: 12 mg/dL (ref 8–23)
CO2: 29 mmol/L (ref 22–32)
Calcium: 8.1 mg/dL — ABNORMAL LOW (ref 8.9–10.3)
Chloride: 103 mmol/L (ref 98–111)
Creatinine, Ser: 0.63 mg/dL (ref 0.61–1.24)
GFR calc Af Amer: >60 mL/min (ref >60)
GFR calc non Af Amer: >60 mL/min (ref >60)
Glucose, Bld: 130 mg/dL — ABNORMAL HIGH (ref 70–99)
Potassium: 3.5 mmol/L (ref 3.5–5.1)
Sodium: 141 mmol/L (ref 135–145)

## 2020-04-19 NOTE — Progress Notes (Signed)
Report called to [facility]. All questions answered. Pt premedicated for ride per daughter in law request. All belongings gathered to be sent back with him.

## 2020-04-19 NOTE — Plan of Care (Signed)
  Problem: Health Behavior/Discharge Planning: Goal: Ability to manage health-related needs will improve Outcome: Adequate for Discharge   Problem: Pain Managment: Goal: General experience of comfort will improve Outcome: Adequate for Discharge   

## 2020-04-19 NOTE — Discharge Summary (Signed)
Physician Discharge Summary  Brandon Young. NFA:[id number] DOB: 1941/06/15 DOA: 04/15/2020  PCP: Billie Ruddy I, NP  Admit date: 04/15/2020 Discharge date: 04/19/2020  Time spent: 45 minutes  Recommendations for Outpatient Follow-up:  Patient will be discharged to [facility], continue physical and occupational therapy.  Patient will need to follow up with primary care provider within one week of discharge, repeat CBC.  Follow-up with Dr. Erlinda Hong, orthopedics, in 2 weeks patient should continue medications as prescribed.  Patient should follow a heart healthy diet.   Discharge Diagnoses:  Minimally displaced left hip fracture and femoral neck Parkinson disease with advanced dementia History of COPD Chronic normocytic anemia  Discharge Condition: Stable  Diet recommendation: heart healthy  Filed Weights   04/16/20 2014 04/17/20 1459  Weight: 63.5 kg 63.5 kg    History of present illness:  On 04/16/2020 by Dr. Sabino Donovan. is a 79 y.o. male with medical history significant for COPD, Parkinson disease with dementia and behavioral disturbance, and history of peptic ulcer, now presenting to the emergency department after a fall at his SNF.  Patient is unable to provide any history, is reportedly ambulatory but does not speak at baseline, fell from a standing position at the SNF, and appeared to be experiencing left hip pain.  Family cannot be reached despite multiple attempts.  Hospital Course:  Minimally displaced left hip fracture and femoral neck -Status post fall -Orthopedics consulted and appreciated, status post intramedullary nail placement on 04/17/2020 for palliative reasons -patient with history of recurrent falls in the past -PT and OT recommended CIR however CIR recommended SNF -Continue pain control -Patient to follow-up with Dr. Erlinda Hong, in 2 weeks for suture removal.  Continue Lovenox 40 mg daily for 2 weeks for DVT prophylaxis.  Bear as tolerated  on the left lower extremity  Parkinson disease with advanced dementia -Does not communicate at baseline -Minimally ambulatory with a walker and has been deteriorating as per family -Continue Risperdal, Lexapro, Sinemet  History of COPD -Appears compensated at this time, continue home inhalers  Chronic normocytic anemia -Hemoglobin appears stable, repeat CBC in 2 weeks  Procedures: Intramedullary nail placement on the left hip, by orthopedics on 04/17/2020  Consultations: Orthopedics  Discharge Exam: Vitals:   04/19/20 0500 04/19/20 0848  BP: (!) 114/51 117/64  Pulse: 69 79  Resp: 17   Temp: 98.1 F (36.7 C) 98.7 F (37.1 C)  SpO2: 92% 95%     General: Well developed, elderly, chronically ill appearing, gen tremors, NAD  HEENT: NCAT, mucous membranes moist.  Cardiovascular: S1 S2 auscultated, RRR  Respiratory: Diminished breath sounds  Abdomen: Soft, nontender, nondistended, + bowel sounds  Extremities: warm dry without cyanosis clubbing or edema  Neuro: Awake and alert, but cannot assess orientation given dementia and Parkinson. Diffuse tremors at rest   Discharge Instructions Discharge Instructions    Discharge instructions   Complete by: As directed    Patient will be discharged to [facility], continue physical and occupational therapy.  Patient will need to follow up with primary care provider within one week of discharge, repeat CBC.  Follow-up with Dr. Erlinda Hong, orthopedics, in 2 weeks patient should continue medications as prescribed.  Patient should follow a heart healthy diet.   Weight bearing as tolerated   Complete by: As directed      Allergies as of 04/19/2020   No Known Allergies     Medication List    TAKE these medications   acetaminophen 325  MG tablet Commonly known as: TYLENOL Take 650 mg by mouth every 4 (four) hours as needed for mild pain.   albuterol 108 (90 Base) MCG/ACT inhaler Commonly known as: VENTOLIN HFA Inhale 2 puffs into  the lungs every 6 (six) hours as needed for wheezing or shortness of breath.   aspirin 81 MG tablet Take 81 mg by mouth daily.   atorvastatin 10 MG tablet Commonly known as: LIPITOR TAKE 1 TABLET BY MOUTH ONCE DAILY   carbidopa-levodopa 25-100 MG tablet Commonly known as: SINEMET IR Take 2 tablets by mouth in the morning and at bedtime.   donepezil 10 MG tablet Commonly known as: ARICEPT TAKE 1 TABLET BY MOUTH AT BEDTIME   enoxaparin 40 MG/0.4ML injection Commonly known as: LOVENOX Inject 0.4 mLs (40 mg total) into the skin daily.   escitalopram 10 MG tablet Commonly known as: LEXAPRO TAKE 1 TABLET EVERY DAY   furosemide 20 MG tablet Commonly known as: LASIX Take 10 mg by mouth See admin instructions. Every two days starting 01/04/20   HYDROcodone-acetaminophen 5-325 MG tablet Commonly known as: Norco Take 1-2 tablets by mouth 3 (three) times daily as needed.   ipratropium-albuterol 0.5-2.5 (3) MG/3ML Soln Commonly known as: DUONEB Take 3 mLs by nebulization in the morning, at noon, and at bedtime.   loratadine 10 MG tablet Commonly known as: CLARITIN Take 10 mg by mouth daily.   melatonin 3 MG Tabs tablet Take 3 mg by mouth at bedtime.   multivitamin with minerals Tabs tablet Take 1 tablet by mouth daily.   omeprazole 20 MG capsule Commonly known as: PRILOSEC Take 20 mg by mouth 2 (two) times daily before a meal.   ondansetron 4 MG disintegrating tablet Commonly known as: ZOFRAN-ODT Take 1 tablet (4 mg total) by mouth every 8 (eight) hours as needed for nausea.   primidone 50 MG tablet Commonly known as: MYSOLINE Take 100 mg by mouth daily. Tapering scale   risperiDONE 0.25 MG tablet Commonly known as: RISPERDAL Take 0.125 mg by mouth 2 (two) times daily.   tamsulosin 0.4 MG Caps capsule Commonly known as: FLOMAX Take 0.4 mg by mouth daily.   vitamin C 250 MG tablet Commonly known as: ASCORBIC ACID Take 500 mg by mouth daily.   Vitamin D  (Ergocalciferol) 1.25 MG (50000 UNIT) Caps capsule Commonly known as: DRISDOL Take 50,000 Units by mouth every 7 (seven) days.   zinc gluconate 50 MG tablet Take 50 mg by mouth daily.            Discharge Care Instructions  (From admission, onward)         Start     Ordered   04/17/20 0000  Weight bearing as tolerated     04/17/20 1621         No Known Allergies Follow-up Information    Tarry Kos, MD In 2 weeks.   Specialty: Orthopedic Surgery Why: For suture removal, For wound re-check Contact information: [address] [place] [address] [phone]        Bill Salinas I, NP. Schedule an appointment as soon as possible for a visit in 1 week(s).   Specialty: Nurse Practitioner Why: Hospital follow up Contact information: [address] [place] [address] [phone]            The results of significant diagnostics from this hospitalization (including imaging, microbiology, ancillary and laboratory) are listed below for reference.    Significant Diagnostic Studies: Pelvis Portable  Result Date:  04/17/2020 CLINICAL DATA:  History of hip replacement EXAM: PORTABLE PELVIS 1-2 VIEWS COMPARISON:  04/15/2020 FINDINGS: Limited evaluation of right femoral neck due to positioning. Interval intramedullary rod fixation of proximal left femur fracture with anatomic alignment. Gas in the soft tissues consistent with recent surgery. IMPRESSION: Interval surgical fixation of proximal left femur fracture with expected surgical changes Electronically Signed   By: Jasmine PangKim  Fujinaga M.D.   On: 04/17/2020 19:07   DG Chest Port 1 View  Result Date: 04/16/2020 CLINICAL DATA:  Preoperative evaluation prior to fractured hip repair. EXAM: PORTABLE CHEST 1 VIEW COMPARISON:  April 12, 2013 FINDINGS: Very mild, chronic appearing increased lung markings are seen. Very mild atelectasis is seen within the mid right lung. There is no evidence of a pleural effusion or  pneumothorax. The heart size and mediastinal contours are within normal limits. Radiopaque surgical clips are seen overlying the expected region of the esophageal hiatus. The visualized skeletal structures are unremarkable. IMPRESSION: Very mild, chronic-appearing increased lung markings with very mild mid right lung atelectasis. Electronically Signed   By: Aram Candelahaddeus  Houston M.D.   On: 04/16/2020 01:13   DG C-Arm 1-60 Min  Result Date: 04/17/2020 CLINICAL DATA:  Known left hip fracture EXAM: LEFT FEMUR 2 VIEWS; DG C-ARM 1-60 MIN COMPARISON:  None. FLUOROSCOPY TIME:  Radiation Exposure Index (as provided by the fluoroscopic device): Not available If the device does not provide the exposure index: Fluoroscopy Time:  53 seconds Number of Acquired Images:  2 FINDINGS: Medullary rod is noted in the proximal left femur with 2 fixation screws traversing the femoral neck. Fracture fragments are in near anatomic alignment. IMPRESSION: ORIF of proximal left hip fracture. Electronically Signed   By: Alcide CleverMark  Lukens M.D.   On: 04/17/2020 16:53   DG Hip Unilat W or Wo Pelvis 2-3 Views Left  Result Date: 04/15/2020 CLINICAL DATA:  79 year old male with fall and left hip pain. EXAM: DG HIP (WITH OR WITHOUT PELVIS) 2-3V LEFT COMPARISON:  None. FINDINGS: There is a minimally displaced basicervical or intertrochanteric fracture of the left femoral neck. No dislocation. The bones are osteopenic. The soft tissues are unremarkable. IMPRESSION: Minimally displaced fracture of the left femoral neck. Electronically Signed   By: Elgie CollardArash  Radparvar M.D.   On: 04/15/2020 22:21   DG FEMUR MIN 2 VIEWS LEFT  Result Date: 04/17/2020 CLINICAL DATA:  Known left hip fracture EXAM: LEFT FEMUR 2 VIEWS; DG C-ARM 1-60 MIN COMPARISON:  None. FLUOROSCOPY TIME:  Radiation Exposure Index (as provided by the fluoroscopic device): Not available If the device does not provide the exposure index: Fluoroscopy Time:  53 seconds Number of Acquired Images:   2 FINDINGS: Medullary rod is noted in the proximal left femur with 2 fixation screws traversing the femoral neck. Fracture fragments are in near anatomic alignment. IMPRESSION: ORIF of proximal left hip fracture. Electronically Signed   By: Alcide CleverMark  Lukens M.D.   On: 04/17/2020 16:53    Microbiology: Recent Results (from the past 240 hour(s))  Respiratory Panel by RT PCR (Flu A&B, Covid) - Nasopharyngeal Swab     Status: None   Collection Time: 04/15/20 11:30 PM   Specimen: Nasopharyngeal Swab  Result Value Ref Range Status   SARS Coronavirus 2 by RT PCR NEGATIVE NEGATIVE Final    Comment: (NOTE) SARS-CoV-2 target nucleic acids are NOT DETECTED. The SARS-CoV-2 RNA is generally detectable in upper respiratoy specimens during the acute phase of infection. The lowest concentration of SARS-CoV-2 viral copies this assay can detect is  131 copies/mL. A negative result does not preclude SARS-Cov-2 infection and should not be used as the sole basis for treatment or other patient management decisions. A negative result may occur with  improper specimen collection/handling, submission of specimen other than nasopharyngeal swab, presence of viral mutation(s) within the areas targeted by this assay, and inadequate number of viral copies (<131 copies/mL). A negative result must be combined with clinical observations, patient history, and epidemiological information. The expected result is Negative. Fact Sheet for Patients:  https://www.moore.com/ Fact Sheet for Healthcare Providers:  https://www.young.biz/ This test is not yet ap proved or cleared by the [place] FDA and  has been authorized for detection and/or diagnosis of SARS-CoV-2 by FDA under an Emergency Use Authorization (EUA). This EUA will remain  in effect (meaning this test can be used) for the duration of the COVID-19 declaration under Section 564(b)(1) of the Act, 21 U.S.C. section  360bbb-3(b)(1), unless the authorization is terminated or revoked sooner.    Influenza A by PCR NEGATIVE NEGATIVE Final   Influenza B by PCR NEGATIVE NEGATIVE Final    Comment: (NOTE) The Xpert Xpress SARS-CoV-2/FLU/RSV assay is intended as an aid in  the diagnosis of influenza from Nasopharyngeal swab specimens and  should not be used as a sole basis for treatment. Nasal washings and  aspirates are unacceptable for Xpert Xpress SARS-CoV-2/FLU/RSV  testing. Fact Sheet for Patients: https://www.moore.com/ Fact Sheet for Healthcare Providers: https://www.young.biz/ This test is not yet approved or cleared by the [place] FDA and  has been authorized for detection and/or diagnosis of SARS-CoV-2 by  FDA under an Emergency Use Authorization (EUA). This EUA will remain  in effect (meaning this test can be used) for the duration of the  Covid-19 declaration under Section 564(b)(1) of the Act, 21  U.S.C. section 360bbb-3(b)(1), unless the authorization is  terminated or revoked. Performed at [facility], [address] [place], [place] [address]   MRSA PCR Screening     Status: None   Collection Time: 04/16/20 11:40 PM   Specimen: Nasal Mucosa; Nasopharyngeal  Result Value Ref Range Status   MRSA by PCR NEGATIVE NEGATIVE Final    Comment:        The GeneXpert MRSA Assay (FDA approved for NASAL specimens only), is one component of a comprehensive MRSA colonization surveillance program. It is not intended to diagnose MRSA infection nor to guide or monitor treatment for MRSA infections. Performed at [facility], [address] [place], [place] [address]   SARS CORONAVIRUS 2 (TAT 6-24 HRS) Nasopharyngeal Nasopharyngeal Swab     Status: None   Collection Time: 04/18/20  2:01 PM   Specimen: Nasopharyngeal Swab  Result Value Ref Range Status   SARS Coronavirus 2 NEGATIVE NEGATIVE Final    Comment:  (NOTE) SARS-CoV-2 target nucleic acids are NOT DETECTED. The SARS-CoV-2 RNA is generally detectable in upper and lower respiratory specimens during the acute phase of infection. Negative results do not preclude SARS-CoV-2 infection, do not rule out co-infections with other pathogens, and should not be used as the sole basis for treatment or other patient management decisions. Negative results must be combined with clinical observations, patient history, and epidemiological information. The expected result is Negative. Fact Sheet for Patients: HairSlick.no Fact Sheet for Healthcare Providers: quierodirigir.com This test is not yet approved or cleared by the [place] FDA and  has been authorized for detection and/or diagnosis of SARS-CoV-2 by FDA under an Emergency Use Authorization (EUA). This EUA  will remain  in effect (meaning this test can be used) for the duration of the COVID-19 declaration under Section 56 4(b)(1) of the Act, 21 U.S.C. section 360bbb-3(b)(1), unless the authorization is terminated or revoked sooner. Performed at [facility], [address], [place], [place] [address]      Labs: Basic Metabolic Panel: Recent Labs  Lab 04/15/20 2315 04/16/20 0500 04/17/20 2131 04/18/20 0620 04/19/20 0211  NA 138 140  --  141 141  K 4.1 4.0  --  3.7 3.5  CL 104 105  --  104 103  CO2 26 28  --  28 29  GLUCOSE 132* 133*  --  116* 130*  BUN 20 21  --  14 12  CREATININE 0.80 0.67 0.64 0.62 0.63  CALCIUM 8.0* 8.1*  --  8.2* 8.1*  MG  --   --   --  1.9  --   PHOS  --   --   --  3.3  --    Liver Function Tests: No results for input(s): AST, ALT, ALKPHOS, BILITOT, PROT, ALBUMIN in the last 168 hours. No results for input(s): LIPASE, AMYLASE in the last 168 hours. No results for input(s): AMMONIA in the last 168 hours. CBC: Recent Labs  Lab 04/15/20 2315 04/16/20 0500 04/17/20 2131 04/18/20 0620  04/19/20 0211  WBC 8.8 8.4 8.9 7.1 7.5  NEUTROABS 7.5  --   --   --   --   HGB 10.9* 10.6* 10.0* 9.8* 9.6*  HCT 35.3* 34.2* 33.0* 31.7* 31.2*  MCV 96.2 95.0 95.1 95.5 93.4  PLT 189 181 154 156 164   Cardiac Enzymes: No results for input(s): CKTOTAL, CKMB, CKMBINDEX, TROPONINI in the last 168 hours. BNP: BNP (last 3 results) No results for input(s): BNP in the last 8760 hours.  ProBNP (last 3 results) No results for input(s): PROBNP in the last 8760 hours.  CBG: No results for input(s): GLUCAP in the last 168 hours.     Signed:  Edsel Petrin  [facility] 04/19/2020, 9:40 AM

## 2020-04-20 NOTE — Care Management (Signed)
04/20/2020, 0830 - Doren Custard, CM from [facility] called and stated that the facility was unable to locate the patient's PASSR nor FL2.  Patient was originally sent from [facility] but did not have a PASSR nor FL2 in the system. I completed the FL2 and PASSR under review.  Clinicals were sent to [facility] [place] will continue to review since patient was transferred to [facility] yesterday.  Doren Custard will continue to follow for needed clinicals for PASSR.

## 2020-04-20 NOTE — NC FL2 (Signed)
[place] LEVEL OF CARE SCREENING TOOL     IDENTIFICATION  Patient Name: Brandon Young. Birthdate: 04-01-1941 Sex: male Admission Date (Current Location): 04/15/2020  [facility] and [place] Number:  Herbalist and Address:  The . [facility], [place] [address], [place], [place] [address]      Provider Number: [id number]  Attending Physician Name and Address:  No att. providers found  Relative Name and Phone Number:  Jaamal Farooqui, daughter    Current Level of Care: SNF Recommended Level of Care: [place] Prior Approval Number:    Date Approved/Denied:   PASRR Number:    Discharge Plan: SNF    Current Diagnoses: Patient Active Problem List   Diagnosis Date Noted  . Displaced intertrochanteric fracture of left femur, initial encounter for closed fracture ([place]) 04/16/2020  . Preventative health care 10/11/2016  . Medicare annual wellness visit, subsequent 10/01/2015  . 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 Elevated PSA 08/22/2013  . Hyperlipidemia, mixed 08/22/2013  . COPD (chronic obstructive pulmonary disease) ([place]) 05/02/2013  . Dementia ([place])   . H/O inguinal hernia repair 06/17/2012  . Hyperglycemia 06/02/2012  . Weight loss 03/31/2012  . Tremor 03/31/2012  . PUD (peptic ulcer disease) 03/31/2012  . ED (erectile dysfunction) 03/31/2012  . Inguinal hernia 03/31/2012    Orientation RESPIRATION BLADDER Height & Weight        Normal Incontinent Weight: 63.5 kg Height:  5\' 8"  (172.7 cm)  BEHAVIORAL SYMPTOMS/MOOD NEUROLOGICAL BOWEL NUTRITION STATUS      Incontinent (see discharge summary)  AMBULATORY STATUS COMMUNICATION OF NEEDS Skin   Total Care Non-Verbally Surgical wounds                       Personal Care Assistance Level of Assistance  Bathing, Feeding, Dressing, Total care Bathing Assistance: Maximum assistance Feeding assistance: Maximum assistance Dressing Assistance:  Maximum assistance Total Care Assistance: Maximum assistance   Functional Limitations Info  Sight, Hearing, Speech Sight Info: Impaired Hearing Info: Adequate Speech Info: Impaired    SPECIAL CARE FACTORS FREQUENCY                       Contractures Contractures Info: Not present    Additional Factors Info  Code Status Code Status Info: DNR             Current Medications (04/20/2020):  This is the current hospital active medication list No current facility-administered medications for this encounter.   Current Outpatient Medications  Medication Sig Dispense Refill  . acetaminophen (TYLENOL) 325 MG tablet Take 650 mg by mouth every 4 (four) hours as needed for mild pain.    Marland Kitchen aspirin 81 MG tablet Take 81 mg by mouth daily.    Marland Kitchen atorvastatin (LIPITOR) 10 MG tablet TAKE 1 TABLET BY MOUTH ONCE DAILY (Patient taking differently: Take 10 mg by mouth daily. ) 90 tablet 2  . carbidopa-levodopa (SINEMET IR) 25-100 MG per tablet Take 2 tablets by mouth in the morning and at bedtime.     . donepezil (ARICEPT) 10 MG tablet TAKE 1 TABLET BY MOUTH AT BEDTIME (Patient taking differently: Take 10 mg by mouth at bedtime. ) 30 tablet 0  . escitalopram (LEXAPRO) 10 MG tablet TAKE 1 TABLET EVERY DAY (Patient taking differently: Take 10 mg by mouth daily. ) 90 tablet 2  . furosemide (LASIX) 20 MG tablet Take 10 mg by mouth See admin instructions.  Every two days starting 01/04/20    . ipratropium-albuterol (DUONEB) 0.5-2.5 (3) MG/3ML SOLN Take 3 mLs by nebulization in the morning, at noon, and at bedtime.    Marland Kitchen loratadine (CLARITIN) 10 MG tablet Take 10 mg by mouth daily.    . melatonin 3 MG TABS tablet Take 3 mg by mouth at bedtime.    . Multiple Vitamin (MULTIVITAMIN WITH MINERALS) TABS tablet Take 1 tablet by mouth daily.    Marland Kitchen omeprazole (PRILOSEC) 20 MG capsule Take 20 mg by mouth 2 (two) times daily before a meal.    . ondansetron (ZOFRAN-ODT) 4 MG disintegrating tablet Take 1 tablet (4  mg total) by mouth every 8 (eight) hours as needed for nausea. 30 tablet 3  . primidone (MYSOLINE) 50 MG tablet Take 100 mg by mouth daily. Tapering scale    . risperiDONE (RISPERDAL) 0.25 MG tablet Take 0.125 mg by mouth 2 (two) times daily.    . tamsulosin (FLOMAX) 0.4 MG CAPS capsule Take 0.4 mg by mouth daily.    . vitamin C (ASCORBIC ACID) 250 MG tablet Take 500 mg by mouth daily.    . Vitamin D, Ergocalciferol, (DRISDOL) 1.25 MG (50000 UNIT) CAPS capsule Take 50,000 Units by mouth every 7 (seven) days.    Marland Kitchen zinc gluconate 50 MG tablet Take 50 mg by mouth daily.    Marland Kitchen albuterol (PROVENTIL HFA;VENTOLIN HFA) 108 (90 BASE) MCG/ACT inhaler Inhale 2 puffs into the lungs every 6 (six) hours as needed for wheezing or shortness of breath. (Patient not taking: Reported on 04/15/2020) 1 Inhaler 5  . enoxaparin (LOVENOX) 40 MG/0.4ML injection Inject 0.4 mLs (40 mg total) into the skin daily. 0.4 mL 13  . HYDROcodone-acetaminophen (NORCO) 5-325 MG tablet Take 1-2 tablets by mouth 3 (three) times daily as needed. 30 tablet 0     Discharge Medications: Please see discharge summary for a list of discharge medications.  Relevant Imaging Results:  Relevant Lab Results:   Additional Information SS# [id number]  Janae Bridgeman, RN

## 2020-05-03 ENCOUNTER — Inpatient Hospital Stay: Payer: Medicare PPO | Admitting: Orthopaedic Surgery

## 2020-08-25 ENCOUNTER — Emergency Department (HOSPITAL_COMMUNITY)

## 2020-08-25 ENCOUNTER — Other Ambulatory Visit: Payer: Self-pay

## 2020-08-25 ENCOUNTER — Emergency Department (HOSPITAL_COMMUNITY)
Admission: EM | Admit: 2020-08-25 | Discharge: 2020-08-25 | Disposition: A | Discharge status: Home / Self Care | Attending: Emergency Medicine | Admitting: Emergency Medicine

## 2020-08-25 ENCOUNTER — Encounter (HOSPITAL_COMMUNITY): Payer: Self-pay

## 2020-08-25 DIAGNOSIS — W050XXA Fall from non-moving wheelchair, initial encounter: Secondary | ICD-10-CM | POA: Diagnosis not present

## 2020-08-25 DIAGNOSIS — S72001A Fracture of unspecified part of neck of right femur, initial encounter for closed fracture: Secondary | ICD-10-CM | POA: Insufficient documentation

## 2020-08-25 DIAGNOSIS — Y9289 Other specified places as the place of occurrence of the external cause: Secondary | ICD-10-CM | POA: Insufficient documentation

## 2020-08-25 DIAGNOSIS — Z79899 Other long term (current) drug therapy: Secondary | ICD-10-CM | POA: Insufficient documentation

## 2020-08-25 DIAGNOSIS — Z20822 Contact with and (suspected) exposure to covid-19: Secondary | ICD-10-CM | POA: Diagnosis not present

## 2020-08-25 DIAGNOSIS — Y9389 Activity, other specified: Secondary | ICD-10-CM | POA: Diagnosis not present

## 2020-08-25 DIAGNOSIS — Y999 Unspecified external cause status: Secondary | ICD-10-CM | POA: Diagnosis not present

## 2020-08-25 DIAGNOSIS — S0990XA Unspecified injury of head, initial encounter: Secondary | ICD-10-CM | POA: Insufficient documentation

## 2020-08-25 DIAGNOSIS — Z87891 Personal history of nicotine dependence: Secondary | ICD-10-CM | POA: Diagnosis not present

## 2020-08-25 DIAGNOSIS — S79911A Unspecified injury of right hip, initial encounter: Secondary | ICD-10-CM | POA: Diagnosis present

## 2020-08-25 DIAGNOSIS — J449 Chronic obstructive pulmonary disease, unspecified: Secondary | ICD-10-CM | POA: Diagnosis not present

## 2020-08-25 LAB — CBC
HCT: 39.5 % (ref 39.0–52.0)
Hemoglobin: 12.4 g/dL — ABNORMAL LOW (ref 13.0–17.0)
MCH: 28.5 pg (ref 26.0–34.0)
MCHC: 31.4 g/dL (ref 30.0–36.0)
MCV: 90.8 fL (ref 80.0–100.0)
Platelets: 289 10*3/uL (ref 150–400)
RBC: 4.35 MIL/uL (ref 4.22–5.81)
RDW: 13.9 % (ref 11.5–15.5)
WBC: 12.8 10*3/uL — ABNORMAL HIGH (ref 4.0–10.5)
nRBC: 0 % (ref 0.0–0.2)

## 2020-08-25 LAB — COMPREHENSIVE METABOLIC PANEL
ALT: 5 U/L (ref 0–44)
AST: 32 U/L (ref 15–41)
Albumin: 2.7 g/dL — ABNORMAL LOW (ref 3.5–5.0)
Alkaline Phosphatase: 96 U/L (ref 38–126)
Anion gap: 9 (ref 5–15)
BUN: 21 mg/dL (ref 8–23)
CO2: 28 mmol/L (ref 22–32)
Calcium: 8.1 mg/dL — ABNORMAL LOW (ref 8.9–10.3)
Chloride: 100 mmol/L (ref 98–111)
Creatinine, Ser: 0.85 mg/dL (ref 0.61–1.24)
GFR calc Af Amer: >60 mL/min (ref >60)
GFR calc non Af Amer: >60 mL/min (ref >60)
Glucose, Bld: 124 mg/dL — ABNORMAL HIGH (ref 70–99)
Potassium: 6 mmol/L — ABNORMAL HIGH (ref 3.5–5.1)
Sodium: 137 mmol/L (ref 135–145)
Total Bilirubin: 1.7 mg/dL — ABNORMAL HIGH (ref 0.3–1.2)
Total Protein: 5.6 g/dL — ABNORMAL LOW (ref 6.5–8.1)

## 2020-08-25 LAB — SARS CORONAVIRUS 2 BY RT PCR (HOSPITAL ORDER, PERFORMED IN ~~LOC~~ HOSPITAL LAB): SARS Coronavirus 2: NEGATIVE

## 2020-08-25 MED ORDER — SODIUM ZIRCONIUM CYCLOSILICATE 10 G PO PACK: 10.0000 g | PACK | Freq: Once | ORAL | Status: DC

## 2020-08-25 MED ORDER — HYDROMORPHONE HCL 1 MG/ML IJ SOLN
0.5000 mg | Freq: Once | INTRAMUSCULAR | Status: AC
  Administered 2020-08-25: 0.5 mg via INTRAVENOUS
  Filled 2020-08-25: qty 1

## 2020-08-25 MED ORDER — OXYCODONE HCL 5 MG PO TABS
5.0000 mg | ORAL_TABLET | ORAL | 0 refills | Status: AC | PRN
Start: 2020-08-25 — End: ?

## 2020-08-25 NOTE — ED Provider Notes (Signed)
[facility] Emergency Department Provider Note MRN:  [id number]  Arrival date & time: 08/25/20     Chief Complaint   Fall and Hip Injury   History of Present Illness   Brandon Young. is a 79 y.o. year-old male with a history of COPD, advanced dementia presenting to the ED with chief complaint of fall.  Patient fell at care facility today, they were able to do an x-ray at the facility and there is concern for femur fracture.  Sent here for further evaluation.  Patient is nonverbal due to advanced dementia.  I was unable to obtain an accurate HPI, PMH, or ROS due to the patient's dementia.  Level 5 caveat.  Review of Systems  Positive for fall, femur fracture.  Patient's Health History    Past Medical History:  Diagnosis Date  . Acute bronchitis 04/14/2013  . Asthma    childhood- age 74  . COPD (chronic obstructive pulmonary disease) (HCC) 05/02/2013  . Dementia (HCC)   . Elevated PSA 08/22/2013  . History of blood transfusion 1988  . History of chicken pox    childhood  . Medicare annual wellness visit, subsequent 10/01/2015  . Other and unspecified hyperlipidemia 08/22/2013  . Preventative health care 10/11/2016  . Ulcer of abdomen wall (HCC) 1988  . 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 ABDOMINAL SURGERY  1988   bleed ulcer  . HERNIA REPAIR  2013  . INTRAMEDULLARY (IM) NAIL INTERTROCHANTERIC Left 04/17/2020   Procedure: INTRAMEDULLARY (IM) NAIL INTERTROCHANTRIC;  Surgeon: Tarry Kos, MD;  Location: [facility];  Service: Orthopedics;  Laterality: Left;  . TONSILLECTOMY      Family History  Problem Relation Age of Onset  . Hypertension Mother   . Dementia Mother   . Other Father        GI infection/disturbance  . Tremor Father   . COPD Sister        smoker  . Tremor Sister   . Tremor Brother   . Tremor Sister   . Colon cancer Neg Hx   . Heart disease Neg Hx   . Prostate cancer Neg Hx     . Breast cancer Neg Hx   . Diabetes Neg Hx     Social History   Socioeconomic History  . Marital status: Married    Spouse name: martha  . Number of children: 2  . Years of education: MED  . Highest education level: Not on file  Occupational History  . Occupation: retired  Tobacco Use  . Smoking status: Former Games developer  . Smokeless tobacco: Never Used  . Tobacco comment: 1 ppd for 30 years  Substance and Sexual Activity  . Alcohol use: Yes    Comment: wine  . Drug use: No  . Sexual activity: Yes    Comment: lives with wife, no major dietary restrictions   Other Topics Concern  . Not on file  Social History Narrative   Patient lives at home with spouse.   Caffeine Use: 2 cups daily   Social Determinants of Health   Financial Resource Strain:   . Difficulty of Paying Living Expenses: Not on file  Food Insecurity:   . Worried About Programme researcher, broadcasting/film/video in the Last Year: Not on file  . Ran Out of Food in the Last Year: Not on file  Transportation Needs:   . Lack of Transportation (Medical): Not on file  . Lack of  Transportation (Non-Medical): Not on file  Physical Activity:   . Days of Exercise per Week: Not on file  . Minutes of Exercise per Session: Not on file  Stress:   . Feeling of Stress : Not on file  Social Connections:   . Frequency of Communication with Friends and Family: Not on file  . Frequency of Social Gatherings with Friends and Family: Not on file  . Attends Religious Services: Not on file  . Active Member of Clubs or Organizations: Not on file  . Attends Banker Meetings: Not on file  . Marital Status: Not on file  Intimate Partner Violence:   . Fear of Current or Ex-Partner: Not on file  . Emotionally Abused: Not on file  . Physically Abused: Not on file  . Sexually Abused: Not on file     Physical Exam   Vitals:   08/25/20 1906 08/25/20 2018  BP: 90/68 (!) 104/57  Pulse: 76 68  Resp: 18 20  Temp:    SpO2: 93% 98%     CONSTITUTIONAL: Chronically ill-appearing, NAD NEURO: Awake, not oriented, moves all extremities equally, unable to answer questions or follow commands EYES:  eyes equal and reactive ENT/NECK:  no LAD, no JVD CARDIO: Regular rate, well-perfused, normal S1 and S2 PULM:  CTAB no wheezing or rhonchi GI/GU:  normal bowel sounds, non-distended, non-tender MSK/SPINE: Left leg is shortened SKIN:  no rash, atraumatic PSYCH:  Appropriate speech and behavior  *Additional and/or pertinent findings included in MDM below  Diagnostic and Interventional Summary    EKG Interpretation  Date/Time:  Friday August 25 2020 20:18:15 EDT Ventricular Rate:  66 PR Interval:  138 QRS Duration: 72 QT Interval:  428 QTC Calculation: 448 R Axis:   75 Text Interpretation: Normal sinus rhythm Normal ECG Confirmed by Kennis Carina [phone]) on 08/25/2020 9:08:52 PM      Labs Reviewed  CBC - Abnormal; Notable for the following components:      Result Value   WBC 12.8 (*)    Hemoglobin 12.4 (*)    All other components within normal limits  COMPREHENSIVE METABOLIC PANEL - Abnormal; Notable for the following components:   Potassium 6.0 (*)    Glucose, Bld 124 (*)    Calcium 8.1 (*)    Total Protein 5.6 (*)    Albumin 2.7 (*)    Total Bilirubin 1.7 (*)    All other components within normal limits  SARS CORONAVIRUS 2 BY RT PCR (HOSPITAL ORDER, PERFORMED IN [facility])    CT Head Wo Contrast  Final Result    CT CERVICAL SPINE WO CONTRAST  Final Result    DG Femur Min 2 Views Left  Final Result    DG Femur Min 2 Views Right  Final Result    DG Pelvis 1-2 Views  Final Result      Medications  HYDROmorphone (DILAUDID) injection 0.5 mg (0.5 mg Intravenous Given 08/25/20 1829)     Procedures  /  Critical Care Procedures  ED Course and Medical Decision Making  I have reviewed the triage vital signs, the nursing notes, and pertinent available records from the EMR.  Listed above  are laboratory and imaging tests that I personally ordered, reviewed, and interpreted and then considered in my medical decision making (see below for details).  Difficult to localize injury due to patient's profound dementia, no significant or localized tenderness.  X-rays are pending.  I discussed patient's care with patient's daughter-in-law, who explains  the patient is DNR, DNI, palliative care, full comfort care but they are still interested in imaging of the femur but also of the brain to ensure there is no significant trauma.  Imaging is pending     Imaging confirmed hip fracture on the right, CT imaging is overall reassuring.  Labs reveal potassium of 6.0, otherwise no abnormalities.  Patient transiently on oxygen by nursing staff after Dilaudid, but I personally discontinued his oxygen and he maintain saturations above 95%.  EKG is without any changes concerning for hyperkalemia, suspect a degree of hemolysis and I discussed patient's care further with daughter-in-law, who explains that he eats only ice cream and they are not aggressively managing his fluid intake or dietary needs.  The goal is comfort, and so a degree of electrolyte disturbance is expected.  Discussed case with Dr. Roda Shutters, who was kind enough to speak with daughter-in-law and we are in agreement with nonoperative management and discharge back to care facility.  Elmer Sow. Pilar Plate, MD [facility] Emergency Medicine [facility] [email or web address]  Final Clinical Impressions(s) / ED Diagnoses     ICD-10-CM   1. Closed fracture of right hip, initial encounter [facility])  S72.001A     ED Discharge Orders         Ordered    oxyCODONE (ROXICODONE) 5 MG immediate release tablet  Every 4 hours PRN        08/25/20 2117           Discharge Instructions Discussed with and Provided to Patient:     Discharge Instructions     You were evaluated in the Emergency Department and after careful evaluation, we did not find  any emergent condition requiring admission or further testing in the hospital.  Your x-ray showed a hip fracture on the right.  The plan is to manage this fracture without surgery.  Please use the pain medication as directed.  You can also use Tylenol 1000 mg every 4-6 hours.  Please return to the Emergency Department if you experience any worsening of your condition.  Thank you for allowing [place] to be a part of your care.        Sabas Sous, MD 08/25/20 2121

## 2020-08-25 NOTE — ED Triage Notes (Signed)
Pt BIB [facility] from [facility] at [facility] c/o of a fall this morning from his wheelchair. Facility did a x-ray which showed a femur fracture. Pt has dementia and parkinson's disease at baseline. Pt is a DNR. Pt unable to answer questions due to pt being non verbal unsure if this is baseline.

## 2020-08-25 NOTE — ED Notes (Signed)
Pt's oxygen saturation dropped to 87% on RA with good waveform. Pt placed on 2L [place]. Pt now sating 98%. Will continue to monitor.

## 2020-08-25 NOTE — ED Notes (Signed)
Spoke with registration at [facility] at [place]. Secretary transferred me to the nursing station. No one picked up but facility is aware pt is returning.

## 2020-08-25 NOTE — ED Notes (Signed)
Patient transported to X-ray 

## 2020-08-25 NOTE — ED Notes (Signed)
Looking back through H&P, pt is non verbal at baseline.

## 2020-08-25 NOTE — Progress Notes (Signed)
[facility]:  [facility]  Pt is a current home care pt at [facility] with hospice care.   Wife confirms she only wants comfort measures. She confirms he is DNR.  Will continue to follow to see how we can assist with d/c plan.   Norm Parcel RN [phone] after hours [phone]

## 2020-08-25 NOTE — Discharge Instructions (Addendum)
You were evaluated in the Emergency Department and after careful evaluation, we did not find any emergent condition requiring admission or further testing in the hospital.  Your x-ray showed a hip fracture on the right.  The plan is to manage this fracture without surgery.  Please use the pain medication as directed.  You can also use Tylenol 1000 mg every 4-6 hours.  Please return to the Emergency Department if you experience any worsening of your condition.  Thank you for allowing [place] to be a part of your care.

## 2020-08-25 NOTE — Progress Notes (Signed)
I spoke to patient's daughter in law, Tresa Endo, who is HCPOA about patient's newly found right subcapital femoral neck fracture.  Recently the patient was placed on full hospice and comfort care.  He has advanced dementia and late stage parkinson's.  Given these circumstances and based on the options for treatment and their risks and benefits, Tresa Endo has elected to attempt nonoperative treatment and pain management.  There is always the option of performing fixation at a later time should pain management not provide him with adequate pain relief.  I have updated Dr. Pilar Plate on our discussion and he will facilitate the patient's transfer back to the SNF.    Mayra Reel, MD [facility] [phone] 9:13 PM

## 2021-03-06 IMAGING — CT CT CERVICAL SPINE W/O CM
3 series · 12 of 33 positions shown, 14 images · non-contrast
Comparison: 07/28/2012

CLINICAL DATA: Fall from wheelchair

EXAM:
CT HEAD WITHOUT CONTRAST
CT CERVICAL SPINE WITHOUT CONTRAST
TECHNIQUE: Multidetector CT imaging of the head and cervical spine was
performed following the standard protocol without intravenous
contrast. Multiplanar CT image reconstructions of the cervical spine
were also generated.

[Series 5: c spine soft · axial · 0.40mm/px · z∈[+1110,+1208]mm · 4 of 71 slices shown, 5 images]
[im 11/71  soft-tissue]
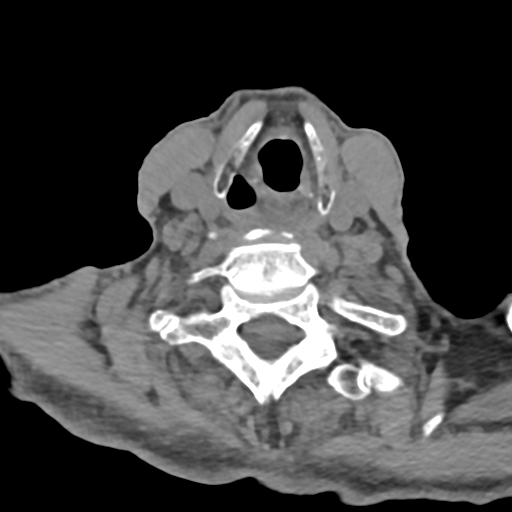
[im 11/71  bone]
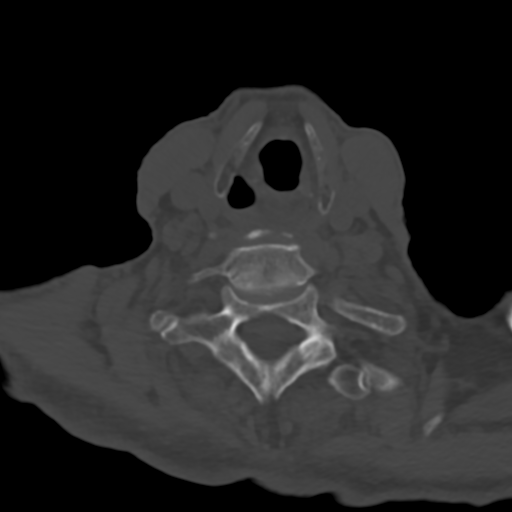
[im 27/71  bone]
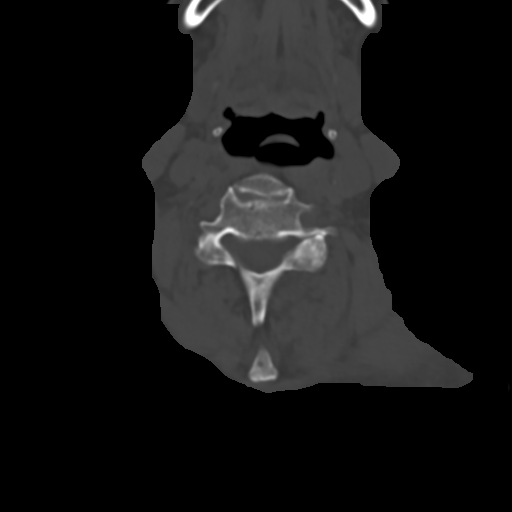
[im 44/71  bone]
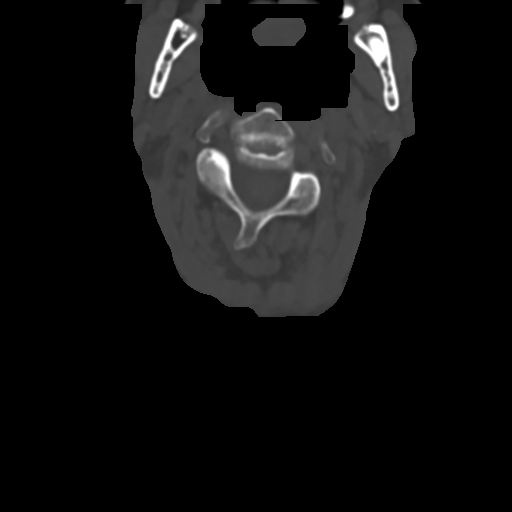
[im 60/71  bone]
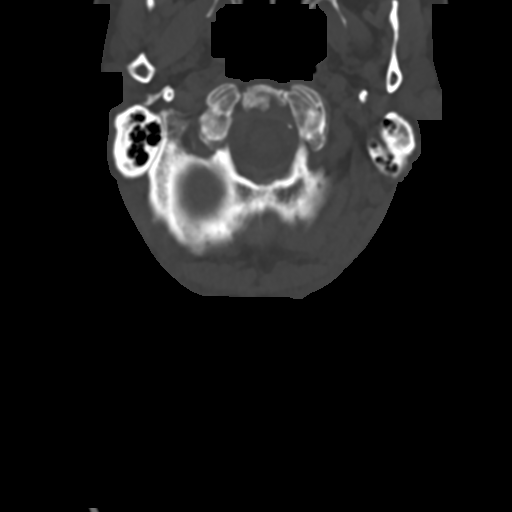

[Series 9: sag bone · sagittal · 0.28mm/px · 5 of 79 slices shown, 6 images]
[im 27/79  bone]
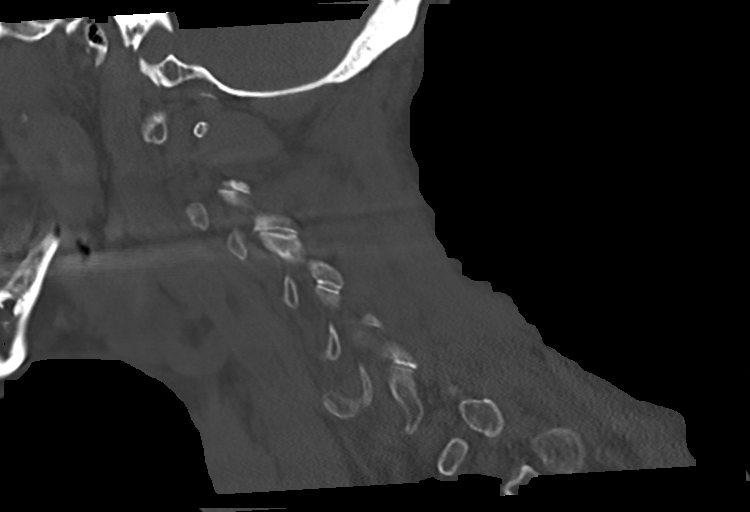
[im 33/79  bone]
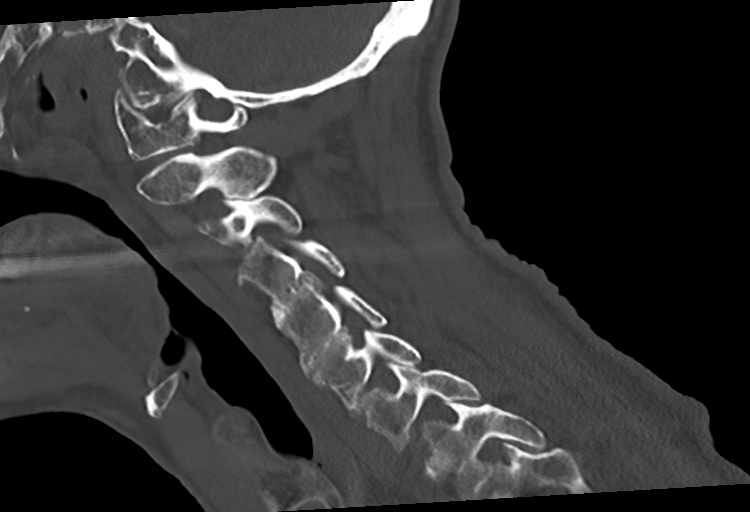
[im 40/79  soft-tissue]
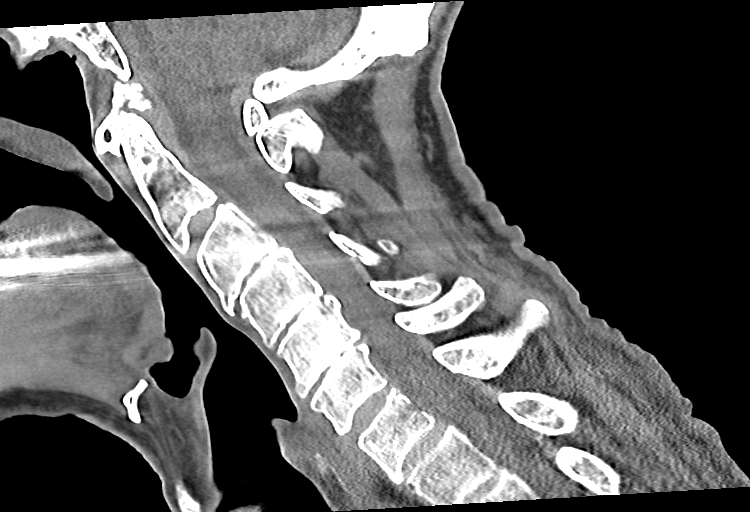
[im 40/79  bone]
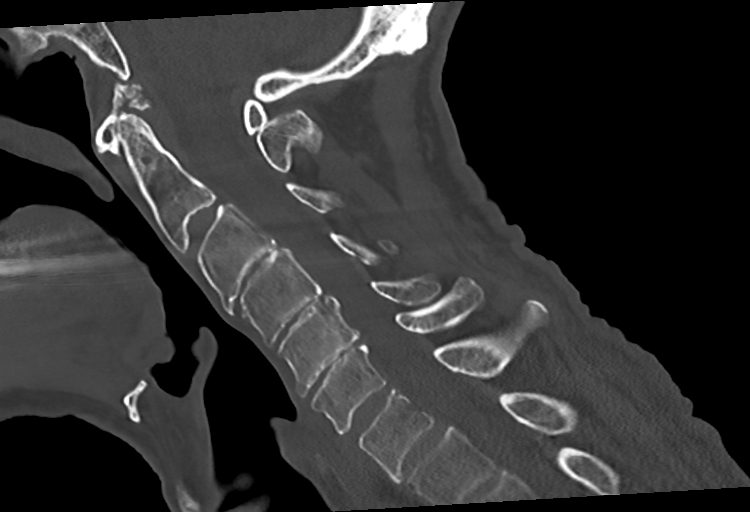
[im 46/79  bone]
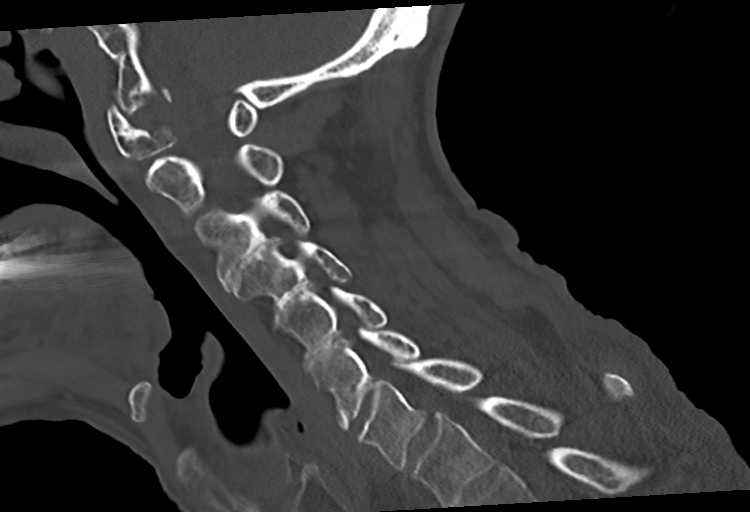
[im 53/79  bone]
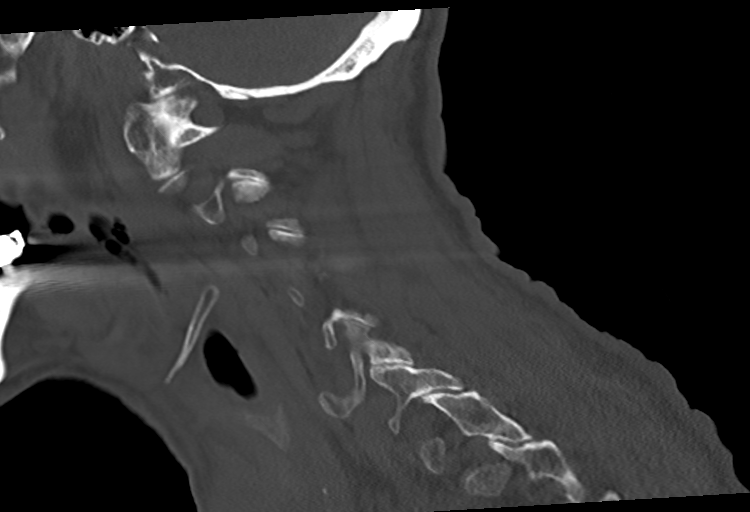

[Series 10: cor bone · coronal · 0.27mm/px · 3 of 91 slices shown]
[im 19/91  bone]
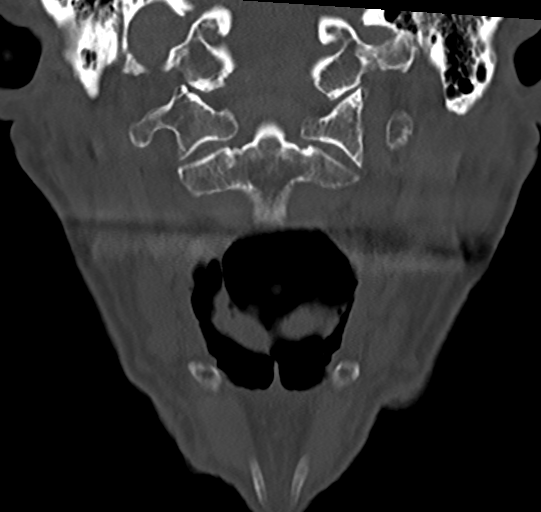
[im 37/91  bone]
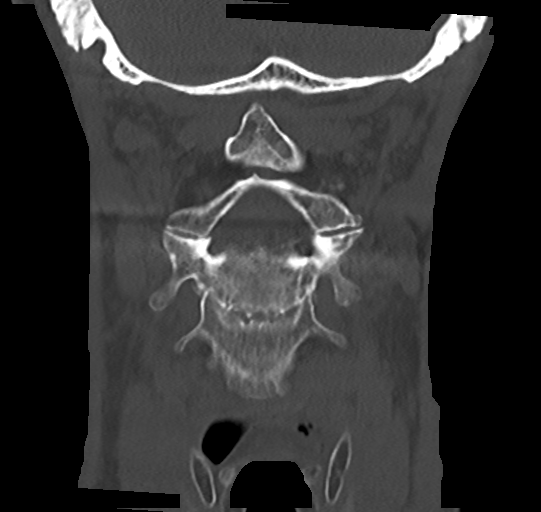
[im 55/91  bone]
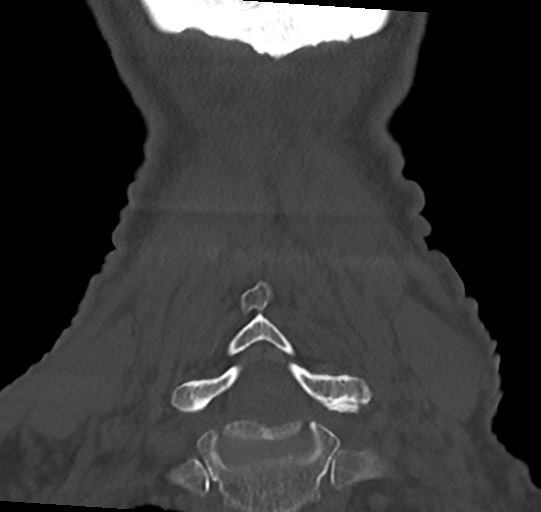

[12 of 33 positions shown; findings below may reference images not displayed]

FINDINGS: CT HEAD FINDINGS

Brain: No evidence of acute infarction, hemorrhage, extra-axial
collection or mass lesion/mass effect. Symmetric prominence of the
ventricles, cisterns and sulci compatible with parenchymal volume
loss and ex vacuo dilatation of the ventricles which appears
progressive from the comparison MRI. Patchy and more confluent areas
of white matter hypoattenuation are most compatible with chronic
microvascular angiopathy.

Vascular: Atherosclerotic calcification of the carotid siphons and
intradural vertebral arteries. No hyperdense vessel.

Skull: There is some mild right parietal soft tissue swelling
without large hematoma or subjacent calvarial fracture. Age
indeterminate deformities of the nasal bones are present without
significant overlying soft tissue swelling favoring remote though
could correlate for point tenderness.

Sinuses/Orbits: Mild nodular mural thickening in the paranasal
sinuses and nasal passages. Leftward nasal septal deviation with a
contacting left-sided nasal septal spur. No air-fluid levels are
pneumatized secretions are seen. Included orbital structures are
unremarkable.

Other: None

CT CERVICAL SPINE FINDINGS

Alignment: Stabilization collar is absent at the time of
examination. There is slight cervical flexion. Straightening of the
normal cervical lordosis with reversal at the C4 level. Some mild
anterolisthesis C7 on T1 of approximately 3 mm is favored to be on a
degenerative basis given some spondylitic and facet degenerative
changes at this level. There is mild anterior widening along the
articulation of the C1-2. However, the atlantodental interval is
maintained and craniocervical articulation is preserved albeit with
some significant arthrosis. Lateral masses. No abnormal or
asymmetrically widened facets. No perched or jumped facets are seen.

Skull base and vertebrae: Advanced arthrosis at the atlantodental
interval with periarticular spurring and degenerative os
odontoideum. No acute skull base fracture. No vertebral body
fracture or height loss. Normal bone mineralization. No worrisome
osseous lesions.

Soft tissues and spinal canal: No pre or paravertebral fluid or
swelling. No visible canal hematoma. Airways are patent.

Disc levels: Multilevel intervertebral disc height loss with
spondylitic endplate changes. Slightly more pronounced disc
osteophyte complexes are present C3-C6 resulting in some at most
mild canal stenosis. Additional multilevel uncinate spurring and
facet hypertrophic changes result in mild-to-moderate multilevel
neural foraminal narrowing maximal C5-6 on the right.

Upper chest: Biapical pleuroparenchymal scarring. No acute
abnormality in the upper chest or imaged lung apices.

Other: Cervical carotid atherosclerosis.
IMPRESSION: 1. No acute intracranial abnormality.
2. Progressive parenchymal volume loss and white matter
microvascular angiopathic changes, now moderate.
3. Mild right parietal soft tissue swelling without large hematoma
or subjacent calvarial fracture.
4. Age indeterminate deformities of the nasal bones are present
without significant overlying soft tissue swelling. Favor remote
deformity though could correlate for point tenderness.
5. Mild anterior widening at the articulation of the lateral masses
of C1-C2. Favor positional given some straightening of the cervical
lordosis and exaggerated flexion at this level as well as the
absence of additional acute traumatic findings or soft tissue
swelling. Correlate with exam findings and if further evaluation is
clinically warranted, MRI could be obtained.
6. No acute cervical spine fracture or other malalignment is seen.
7. Multilevel degenerative changes of the cervical spine as
described above.
8. Cervical and intracranial atherosclerosis.

## 2022-01-23 DEATH — deceased
